# Patient Record
Sex: Male | Born: 1950 | Race: White | Hispanic: No | Marital: Married | Smoking: Current some day smoker
Health system: Southern US, Community
[De-identification: ages and names within clinical notes are randomized; demographics above are authoritative.]

## PROBLEM LIST (undated history)

## (undated) DIAGNOSIS — T7840XA Allergy, unspecified, initial encounter: Secondary | ICD-10-CM

## (undated) DIAGNOSIS — E785 Hyperlipidemia, unspecified: Secondary | ICD-10-CM

## (undated) DIAGNOSIS — M1990 Unspecified osteoarthritis, unspecified site: Secondary | ICD-10-CM

## (undated) DIAGNOSIS — J45901 Unspecified asthma with (acute) exacerbation: Secondary | ICD-10-CM

## (undated) DIAGNOSIS — R7302 Impaired glucose tolerance (oral): Secondary | ICD-10-CM

## (undated) DIAGNOSIS — J309 Allergic rhinitis, unspecified: Secondary | ICD-10-CM

## (undated) HISTORY — PX: TONSILLECTOMY: SHX5217

## (undated) HISTORY — DX: Allergy, unspecified, initial encounter: T78.40XA

## (undated) HISTORY — DX: Hyperlipidemia, unspecified: E78.5

## (undated) HISTORY — DX: Allergic rhinitis, unspecified: J30.9

## (undated) HISTORY — DX: Unspecified osteoarthritis, unspecified site: M19.90

## (undated) HISTORY — DX: Unspecified asthma with (acute) exacerbation: J45.901

## (undated) HISTORY — DX: Impaired glucose tolerance (oral): R73.02

---

## 2005-12-09 ENCOUNTER — Encounter: Admission: RE | Admit: 2005-12-09 | Discharge: 2005-12-09 | Payer: Self-pay | Admitting: Emergency Medicine

## 2008-02-12 ENCOUNTER — Ambulatory Visit: Payer: Self-pay | Admitting: Internal Medicine

## 2008-02-12 DIAGNOSIS — J45901 Unspecified asthma with (acute) exacerbation: Secondary | ICD-10-CM | POA: Insufficient documentation

## 2008-02-12 DIAGNOSIS — J309 Allergic rhinitis, unspecified: Secondary | ICD-10-CM | POA: Insufficient documentation

## 2008-02-12 DIAGNOSIS — E785 Hyperlipidemia, unspecified: Secondary | ICD-10-CM | POA: Insufficient documentation

## 2008-02-12 DIAGNOSIS — J45909 Unspecified asthma, uncomplicated: Secondary | ICD-10-CM | POA: Insufficient documentation

## 2008-02-12 HISTORY — DX: Allergic rhinitis, unspecified: J30.9

## 2008-02-12 HISTORY — DX: Unspecified asthma with (acute) exacerbation: J45.901

## 2008-02-12 HISTORY — DX: Hyperlipidemia, unspecified: E78.5

## 2008-02-13 ENCOUNTER — Telehealth (INDEPENDENT_AMBULATORY_CARE_PROVIDER_SITE_OTHER): Payer: Self-pay | Admitting: *Deleted

## 2008-02-13 LAB — CONVERTED CEMR LAB
ALT: 46 units/L (ref 0–53)
Albumin: 4.5 g/dL (ref 3.5–5.2)
Bilirubin Urine: NEGATIVE
Direct LDL: 147.3 mg/dL
Eosinophils Absolute: 0.3 10*3/uL (ref 0.0–0.7)
Eosinophils Relative: 4 % (ref 0.0–5.0)
GFR calc non Af Amer: 82 mL/min
HCT: 46.2 % (ref 39.0–52.0)
HDL: 28.2 mg/dL — ABNORMAL LOW (ref >39.0)
Hemoglobin, Urine: NEGATIVE
Ketones, ur: NEGATIVE mg/dL
MCHC: 34.9 g/dL (ref 30.0–36.0)
Monocytes Relative: 7.2 % (ref 3.0–12.0)
Neutro Abs: 6 10*3/uL (ref 1.4–7.7)
Neutrophils Relative %: 71.4 % (ref 43.0–77.0)
Platelets: 218 10*3/uL (ref 150–400)
Potassium: 4.1 meq/L (ref 3.5–5.1)
RDW: 12.2 % (ref 11.5–14.6)
Sodium: 138 meq/L (ref 135–145)
TSH: 1.24 microintl units/mL (ref 0.35–5.50)
Total Bilirubin: 1.4 mg/dL — ABNORMAL HIGH (ref 0.3–1.2)
Total Protein: 7.4 g/dL (ref 6.0–8.3)
Triglycerides: 190 mg/dL — ABNORMAL HIGH (ref 0–149)
Urine Glucose: NEGATIVE mg/dL

## 2009-11-21 ENCOUNTER — Encounter: Payer: Self-pay | Admitting: Internal Medicine

## 2010-01-31 NOTE — Miscellaneous (Signed)
Summary: Immunization Entry   Immunization History:  Influenza Immunization History:    Influenza:  historical (11/21/2009)  [facility], [address] [place] Vaccine, Fluvirin Dose, 0.5 ML Site, Left Deltoid IM Mfg. [employer] Date Administered 11/21/2009 Lot # [id number]

## 2010-09-05 ENCOUNTER — Encounter: Payer: Self-pay | Admitting: Internal Medicine

## 2010-09-16 ENCOUNTER — Other Ambulatory Visit: Payer: Self-pay | Admitting: Internal Medicine

## 2010-09-16 DIAGNOSIS — Z0000 Encounter for general adult medical examination without abnormal findings: Secondary | ICD-10-CM

## 2010-09-16 DIAGNOSIS — Z0389 Encounter for observation for other suspected diseases and conditions ruled out: Secondary | ICD-10-CM

## 2010-09-18 ENCOUNTER — Other Ambulatory Visit: Payer: Self-pay | Admitting: Internal Medicine

## 2010-09-18 ENCOUNTER — Other Ambulatory Visit (INDEPENDENT_AMBULATORY_CARE_PROVIDER_SITE_OTHER): Payer: Self-pay

## 2010-09-18 DIAGNOSIS — Z0389 Encounter for observation for other suspected diseases and conditions ruled out: Secondary | ICD-10-CM

## 2010-09-18 DIAGNOSIS — Z0000 Encounter for general adult medical examination without abnormal findings: Secondary | ICD-10-CM

## 2010-09-18 LAB — LDL CHOLESTEROL, DIRECT: Direct LDL: 126.4 mg/dL

## 2010-09-18 LAB — LIPID PANEL
HDL: 34.7 mg/dL — ABNORMAL LOW (ref >39.00)
Total CHOL/HDL Ratio: 7
Triglycerides: 300 mg/dL — ABNORMAL HIGH (ref 0.0–149.0)
VLDL: 60 mg/dL — ABNORMAL HIGH (ref 0.0–40.0)

## 2010-09-18 LAB — CBC WITH DIFFERENTIAL/PLATELET
Basophils Relative: 0.6 % (ref 0.0–3.0)
Eosinophils Absolute: 0.2 10*3/uL (ref 0.0–0.7)
Eosinophils Relative: 3.9 % (ref 0.0–5.0)
HCT: 45.7 % (ref 39.0–52.0)
Lymphocytes Relative: 27.6 % (ref 12.0–46.0)
Lymphs Abs: 1.4 10*3/uL (ref 0.7–4.0)
MCV: 94.5 fl (ref 78.0–100.0)
Monocytes Absolute: 0.4 10*3/uL (ref 0.1–1.0)
Neutrophils Relative %: 59.7 % (ref 43.0–77.0)
Platelets: 180 10*3/uL (ref 150.0–400.0)
WBC: 5.3 10*3/uL (ref 4.5–10.5)

## 2010-09-18 LAB — COMPREHENSIVE METABOLIC PANEL
Alkaline Phosphatase: 76 U/L (ref 39–117)
GFR: 73.22 mL/min (ref >60.00)
Sodium: 137 mEq/L (ref 135–145)

## 2010-09-18 LAB — URINALYSIS, ROUTINE W REFLEX MICROSCOPIC
Bilirubin Urine: NEGATIVE
Nitrite: NEGATIVE
Specific Gravity, Urine: 1.025 (ref 1.000–1.030)
Total Protein, Urine: NEGATIVE

## 2010-09-18 LAB — PSA: PSA: 1.31 ng/mL (ref 0.10–4.00)

## 2010-09-20 LAB — HEMOGLOBIN A1C: Hgb A1c MFr Bld: 5.4 % (ref 4.6–6.5)

## 2010-09-24 ENCOUNTER — Encounter: Payer: Self-pay | Admitting: Internal Medicine

## 2010-09-24 DIAGNOSIS — Z0000 Encounter for general adult medical examination without abnormal findings: Secondary | ICD-10-CM | POA: Insufficient documentation

## 2010-09-24 DIAGNOSIS — Z0001 Encounter for general adult medical examination with abnormal findings: Secondary | ICD-10-CM | POA: Insufficient documentation

## 2010-09-25 ENCOUNTER — Encounter: Payer: Self-pay | Admitting: Internal Medicine

## 2010-09-25 ENCOUNTER — Ambulatory Visit (INDEPENDENT_AMBULATORY_CARE_PROVIDER_SITE_OTHER): Payer: BC Managed Care – PPO | Admitting: Internal Medicine

## 2010-09-25 VITALS — BP 104/70 | HR 94 | Temp 98.2°F | Ht 73.0 in | Wt 211.1 lb

## 2010-09-25 DIAGNOSIS — Z23 Encounter for immunization: Secondary | ICD-10-CM

## 2010-09-25 DIAGNOSIS — Z2911 Encounter for prophylactic immunotherapy for respiratory syncytial virus (RSV): Secondary | ICD-10-CM

## 2010-09-25 DIAGNOSIS — Z0000 Encounter for general adult medical examination without abnormal findings: Secondary | ICD-10-CM

## 2010-09-25 MED ORDER — LEVALBUTEROL TARTRATE 45 MCG/ACT IN AERO: 2.0000 | INHALATION_SPRAY | Freq: Four times a day (QID) | RESPIRATORY_TRACT | Status: DC | PRN

## 2010-09-25 MED ORDER — PNEUMOCOCCAL VAC POLYVALENT 25 MCG/0.5ML IJ INJ: 0.5000 mL | INJECTION | Freq: Once | INTRAMUSCULAR | Status: DC

## 2010-09-25 MED ORDER — MONTELUKAST SODIUM 10 MG PO TABS: 10.0000 mg | ORAL_TABLET | Freq: Every day | ORAL | Status: DC

## 2010-09-25 NOTE — Patient Instructions (Signed)
You had the pneumonia and shingles shots today OK to take just the aspirin (coated) at 81 mg per day Continue all other medications as before (except ok to stop the advair) Please follow lower cholesterol, lower fat diet You are given the work note for the shoes today Please return in 1 year for your yearly visit, or sooner if needed, with Lab testing done 3-5 days before

## 2010-09-25 NOTE — Assessment & Plan Note (Addendum)
Overall doing well, age appropriate education and counseling updated, referrals for preventative services and immunizations addressed, dietary and smoking counseling addressed, most recent labs and ECG reviewed.  I have personally reviewed and have noted: 1) the patient's medical and social history 2) The pt's use of alcohol, tobacco, and illicit drugs 3) The patient's current medications and supplements 4) Functional ability including ADL's, fall risk, home safety risk, hearing and visual impairment 5) Diet and physical activities 6) Evidence for depression or mood disorder Ok for work note as requested. To decrease the ASSA to 81 mg again for prophylaxis.  For better lower chol diet 7) The patient's height, weight, and BMI have been recorded in the chart I have made referrals, and provided counseling and education based on review of the above ECG reviewed as per emr

## 2010-10-01 ENCOUNTER — Encounter: Payer: Self-pay | Admitting: Internal Medicine

## 2010-10-01 NOTE — Progress Notes (Signed)
Subjective:    Patient ID: Mark Wilson, male    DOB: 10/24/1950, 60 y.o.   MRN: [id number]  HPI  Here for wellness and f/u;  Overall doing ok;  Pt denies CP, worsening SOB, DOE, wheezing, orthopnea, PND, worsening LE edema, palpitations, dizziness or syncope.  Pt denies neurological change such as new Headache, facial or extremity weakness.  Pt denies polydipsia, polyuria, or low sugar symptoms. Pt states overall good compliance with treatment and medications, good tolerability, and trying to follow lower cholesterol diet.  Pt denies worsening depressive symptoms, suicidal ideation or panic. No fever, wt loss, night sweats, loss of appetite, or other constitutional symptoms.  Pt states good ability with ADL's, low fall risk, home safety reviewed and adequate, no significant changes in hearing or vision, and occasionally active with exercise.  Has not needed advair so no longer taking.  Is taking asa 325 on his own, hoping might help with right hip apin but not.  Needs note for work, has no right leg or hip pain when does not wear steel toed shoes at times when going up and down stairs. Past Medical History  Diagnosis Date  . ALLERGIC RHINITIS 02/12/2008  . ASTHMA UNSPECIFIED WITH EXACERBATION 02/12/2008  . HYPERLIPIDEMIA 02/12/2008   Past Surgical History  Procedure Date  . Tonsillectomy     reports that he has been smoking.  He does not have any smokeless tobacco history on file. His alcohol and drug histories not on file. family history includes Breast cancer in his maternal grandmother; Depression in his mother; Diabetes in his maternal grandmother; and Hypertension in his mother. Allergies  Allergen Reactions  . Penicillins    Current Outpatient Prescriptions on File Prior to Visit  Medication Sig Dispense Refill  . aspirin 81 MG tablet Take 81 mg by mouth daily.         No current facility-administered medications on file prior to visit.   Review of Systems Review of Systems    Constitutional: Negative for diaphoresis, activity change, appetite change and unexpected weight change.  HENT: Negative for hearing loss, ear pain, facial swelling, mouth sores and neck stiffness.   Eyes: Negative for pain, redness and visual disturbance.  Respiratory: Negative for shortness of breath and wheezing.   Cardiovascular: Negative for chest pain and palpitations.  Gastrointestinal: Negative for diarrhea, blood in stool, abdominal distention and rectal pain.  Genitourinary: Negative for hematuria, flank pain and decreased urine volume.  Musculoskeletal: Negative for myalgias and joint swelling.  Skin: Negative for color change and wound.  Neurological: Negative for syncope and numbness.  Hematological: Negative for adenopathy.  Psychiatric/Behavioral: Negative for hallucinations, self-injury, decreased concentration and agitation.      Objective:   Physical Exam BP 104/70  Pulse 94  Temp(Src) 98.2 F (36.8 C) (Oral)  Ht 6\' 1"  (1.854 m)  Wt 211 lb 2 oz (95.766 kg)  BMI 27.85 kg/m2  SpO2 97% Physical Exam  VS noted Constitutional: Pt is oriented to person, place, and time. Appears well-developed and well-nourished.  HENT:  Head: Normocephalic and atraumatic.  Right Ear: External ear normal.  Left Ear: External ear normal.  Nose: Nose normal.  Mouth/Throat: Oropharynx is clear and moist.  Eyes: Conjunctivae and EOM are normal. Pupils are equal, round, and reactive to light.  Neck: Normal range of motion. Neck supple. No JVD present. No tracheal deviation present.  Cardiovascular: Normal rate, regular rhythm, normal heart sounds and intact distal pulses.   Pulmonary/Chest: Effort normal and breath sounds normal.  Abdominal: Soft. Bowel sounds are normal. There is no tenderness.  Musculoskeletal: Normal range of motion. Exhibits no edema.  Lymphadenopathy:  Has no cervical adenopathy.  Neurological: Pt is alert and oriented to person, place, and time. Pt has normal  reflexes. No cranial nerve deficit.  Skin: Skin is warm and dry. No rash noted.  Psychiatric:  Has  normal mood and affect. Behavior is normal.     Assessment & Plan:

## 2010-10-18 ENCOUNTER — Telehealth: Payer: Self-pay

## 2010-10-18 NOTE — Telephone Encounter (Signed)
Pt singulair PA approved from 10/17/2010 07/12/2013. Pharmacy advised of same

## 2010-12-28 ENCOUNTER — Ambulatory Visit (INDEPENDENT_AMBULATORY_CARE_PROVIDER_SITE_OTHER): Payer: BC Managed Care – PPO | Admitting: Internal Medicine

## 2010-12-28 ENCOUNTER — Encounter: Payer: Self-pay | Admitting: Internal Medicine

## 2010-12-28 VITALS — BP 110/80 | HR 78 | Temp 97.9°F | Ht 73.0 in | Wt 218.0 lb

## 2010-12-28 DIAGNOSIS — J45909 Unspecified asthma, uncomplicated: Secondary | ICD-10-CM

## 2010-12-28 DIAGNOSIS — L989 Disorder of the skin and subcutaneous tissue, unspecified: Secondary | ICD-10-CM

## 2010-12-28 DIAGNOSIS — R7309 Other abnormal glucose: Secondary | ICD-10-CM

## 2010-12-28 DIAGNOSIS — Z0000 Encounter for general adult medical examination without abnormal findings: Secondary | ICD-10-CM

## 2010-12-28 DIAGNOSIS — E785 Hyperlipidemia, unspecified: Secondary | ICD-10-CM

## 2010-12-28 DIAGNOSIS — R7302 Impaired glucose tolerance (oral): Secondary | ICD-10-CM

## 2010-12-28 HISTORY — DX: Impaired glucose tolerance (oral): R73.02

## 2010-12-28 MED ORDER — ATORVASTATIN CALCIUM 10 MG PO TABS: 10.0000 mg | ORAL_TABLET | Freq: Every day | ORAL | Status: DC

## 2010-12-28 NOTE — Assessment & Plan Note (Signed)
Uncontrolled, after further discussion, pt ok with starting liptor 10 mg, f/u labs next visit;  Last ldl 129, goal < 100

## 2010-12-28 NOTE — Assessment & Plan Note (Signed)
Right upper lip, warty in appearance, but cant r/o skin ca - for derm referral

## 2010-12-28 NOTE — Assessment & Plan Note (Signed)
stable overall by hx and exam, most recent data reviewed with pt, and pt to continue medical treatment as before  Lab Results  Component Value Date   HGBA1C 5.4 09/18/2010

## 2010-12-28 NOTE — Progress Notes (Signed)
  Subjective:    Patient ID: Mark Wilson, male    DOB: [birth year], 60 y.o.   MRN: [id number]  HPI  Here to f/u; overall doing ok,  Pt denies chest pain, increased sob or doe, wheezing, orthopnea, PND, increased LE swelling, palpitations, dizziness or syncope.  Pt denies new neurological symptoms such as new headache, or facial or extremity weakness or numbness   Pt denies polydipsia, polyuria.  Pt states overall good compliance with meds, trying to follow lower cholesterol diet, wt overall stable but little exercise however.   Pt denies fever, wt loss, night sweats, loss of appetite, or other constitutional symptoms  Denies worsening depressive symptoms, suicidal ideation, or panic, though has ongoing anxiety, not increased recently.   Does have persistent skin lesion to area below the nares, above the right upper lip.  Due for colonscopy and would like to be referred Past Medical History  Diagnosis Date  . ALLERGIC RHINITIS 02/12/2008  . ASTHMA UNSPECIFIED WITH EXACERBATION 02/12/2008  . HYPERLIPIDEMIA 02/12/2008  . Impaired glucose tolerance 12/28/2010   Past Surgical History  Procedure Date  . Tonsillectomy     reports that he has been smoking.  He does not have any smokeless tobacco history on file. His alcohol and drug histories not on file. family history includes Breast cancer in his maternal grandmother; Depression in his mother; Diabetes in his maternal grandmother; and Hypertension in his mother. Allergies  Allergen Reactions  . Penicillins    Current Outpatient Prescriptions on File Prior to Visit  Medication Sig Dispense Refill  . aspirin 81 MG tablet Take 81 mg by mouth daily.        Marland Kitchen levalbuterol (XOPENEX HFA) 45 MCG/ACT inhaler Inhale 2 puffs into the lungs 4 (four) times daily as needed.  1 Inhaler  11  . montelukast (SINGULAIR) 10 MG tablet Take 1 tablet (10 mg total) by mouth at bedtime.  90 tablet  3   Current Facility-Administered Medications on File Prior to Visit    Medication Dose Route Frequency Provider Last Rate Last Dose  . pneumococcal 23 valent vaccine (PNU-IMMUNE) injection 0.5 mL  0.5 mL Intramuscular Once Oliver Barre, MD       Review of Systems Review of Systems  Constitutional: Negative for diaphoresis and unexpected weight change.  HENT: Negative for drooling and tinnitus.   Eyes: Negative for photophobia and visual disturbance.  Respiratory: Negative for choking and stridor.   Gastrointestinal: Negative for vomiting and blood in stool.  Genitourinary: Negative for hematuria and decreased urine volume.    Objective:   Physical Exam BP 110/80  Pulse 78  Temp(Src) 97.9 F (36.6 C) (Oral)  Ht 6\' 1"  (1.854 m)  Wt 218 lb (98.884 kg)  BMI 28.76 kg/m2  SpO2 96% Physical Exam  VS noted Constitutional: Pt appears well-developed and well-nourished.  HENT: Head: Normocephalic.  Right Ear: External ear normal.  Left Ear: External ear normal.  Eyes: Conjunctivae and EOM are normal. Pupils are equal, round, and reactive to light.  Neck: Normal range of motion. Neck supple.  Cardiovascular: Normal rate and regular rhythm.   Pulmonary/Chest: Effort normal and breath sounds normal.  Neurological: Pt is alert. No cranial nerve deficit.  Skin: Skin is warm. No erythema. approx 10 mm lesion to skin area below right nares/above right upper lip, NT, ? Warty in appearance, NT Psychiatric: Pt behavior is normal. Thought content normal.     Assessment & Plan:

## 2010-12-28 NOTE — Assessment & Plan Note (Signed)
stable overall by hx and exam, most recent data reviewed with pt, and pt to continue medical treatment as before  SpO2 Readings from Last 3 Encounters:  12/28/10 96%  09/25/10 97%

## 2010-12-28 NOTE — Assessment & Plan Note (Signed)
Not charged today, but now states ok with having screening colonsocpy - will refer

## 2010-12-28 NOTE — Patient Instructions (Addendum)
Take all new medications as prescribed - the generic lipitor 10 mg per day Continue all other medications as before You will be contacted regarding the referral for: dermatology, and colonoscopy Please return in Sept 2013 with Lab testing done 3-5 days before

## 2011-02-22 ENCOUNTER — Encounter: Payer: Self-pay | Admitting: Gastroenterology

## 2011-02-22 ENCOUNTER — Ambulatory Visit (AMBULATORY_SURGERY_CENTER): Payer: BC Managed Care – PPO | Admitting: *Deleted

## 2011-02-22 VITALS — Ht 72.0 in | Wt 217.4 lb

## 2011-02-22 DIAGNOSIS — Z1211 Encounter for screening for malignant neoplasm of colon: Secondary | ICD-10-CM

## 2011-02-22 MED ORDER — PEG-KCL-NACL-NASULF-NA ASC-C 100 G PO SOLR: ORAL | Status: DC

## 2011-03-08 ENCOUNTER — Encounter: Payer: Self-pay | Admitting: Gastroenterology

## 2011-03-08 ENCOUNTER — Ambulatory Visit (AMBULATORY_SURGERY_CENTER): Payer: BC Managed Care – PPO | Admitting: Gastroenterology

## 2011-03-08 VITALS — BP 155/95 | HR 90 | Temp 97.7°F | Resp 18 | Ht 72.0 in | Wt 217.0 lb

## 2011-03-08 DIAGNOSIS — D126 Benign neoplasm of colon, unspecified: Secondary | ICD-10-CM

## 2011-03-08 DIAGNOSIS — Z1211 Encounter for screening for malignant neoplasm of colon: Secondary | ICD-10-CM

## 2011-03-08 MED ORDER — SODIUM CHLORIDE 0.9 % IV SOLN: 500.0000 mL | INTRAVENOUS | Status: DC

## 2011-03-08 NOTE — Op Note (Signed)
[facility] [address] [place], [place]  [address]  COLONOSCOPY PROCEDURE REPORT  PATIENT:  Mark, Wilson  MR#:  [id number] BIRTHDATE:  June 27, 1950, 60 yrs. old  GENDER:  male ENDOSCOPIST:  Judie Petit T. Russella Dar, MD, [facility] Referred by:  Oliver Barre, M.D. PROCEDURE DATE:  03/08/2011 PROCEDURE:  Colonoscopy with biopsy and snare polypectomy ASA CLASS:  Class II INDICATIONS:  1) Routine Risk Screening MEDICATIONS:   These medications were titrated to patient response per physician's verbal order, Fentanyl 75 mcg IV, Versed 8 mg IV DESCRIPTION OF PROCEDURE:   After the risks benefits and alternatives of the procedure were thoroughly explained, informed consent was obtained.  Digital rectal exam was performed and revealed no abnormalities.   The [id number] endoscope was introduced through the anus and advanced to the cecum, which was identified by both the appendix and ileocecal valve, without limitations.  The quality of the prep was good, using MoviPrep. The instrument was then slowly withdrawn as the colon was fully examined. <<PROCEDUREIMAGES>> FINDINGS:  Moderate diverticulosis was found in the sigmoid to descending colon.  A sessile polyp was found in the sigmoid colon. It was 6 mm in size. Polyp was snared without cautery. Retrieval was successful. A sessile polyp was found in the sigmoid colon. It was 4 mm in size. The polyp was removed using cold biopsy forceps. Otherwise normal colonoscopy without other polyps, masses, vascular ectasias, or inflammatory changes.   Retroflexed views in the rectum revealed no abnormalities.  The time to cecum =  2 minutes. The scope was then withdrawn (time =  12.5  min) from the patient and the procedure completed.  COMPLICATIONS:  None  ENDOSCOPIC IMPRESSION: 1) Moderate diverticulosis in the sigmoid to descending colon 2) 6 mm sessile polyp in the sigmoid colon 3) 4 mm sessile polyp in the sigmoid colon  RECOMMENDATIONS: 1) Await  pathology results 2) High fiber diet with liberal fluid intake. 3) If the polyps are adenomatous (pre-cancerous), repeat colonoscopy in 5 years. Otherwise follow colorectal cancer screening guidelines for "routine risk" patients with colonoscopy in 10 years.  Venita Lick. Russella Dar, MD, [facility]  n. eSIGNED:   Venita Lick. Lorelei Heikkila at 03/08/2011 09:46 AM  Delight Ovens, [phone]

## 2011-03-08 NOTE — Progress Notes (Signed)
No complaints noted in the recovery room. Maw  Patient did not experience any of the following events: a burn prior to discharge; a fall within the facility; wrong site/side/patient/procedure/implant event; or a hospital transfer or hospital admission upon discharge from the facility. [phone]) Patient did not have preoperative order for IV antibiotic SSI prophylaxis. [phone])    Pt waited to be d/c dur to md needed to complete md eval post procedure. Maw

## 2011-03-08 NOTE — Patient Instructions (Signed)
Resume your prior medications today. Please call if any questions or concerns. Handouts given on Diverticulosis, High Fiber Diet with liberal fluid intake and polyps.   YOU HAD AN ENDOSCOPIC PROCEDURE TODAY AT THE [facility]: Refer to the procedure report that was given to you for any specific questions about what was found during the examination.  If the procedure report does not answer your questions, please call your gastroenterologist to clarify.  If you requested that your care partner not be given the details of your procedure findings, then the procedure report has been included in a sealed envelope for you to review at your convenience later.  YOU SHOULD EXPECT: Some feelings of bloating in the abdomen. Passage of more gas than usual.  Walking can help get rid of the air that was put into your GI tract during the procedure and reduce the bloating. If you had a lower endoscopy (such as a colonoscopy or flexible sigmoidoscopy) you may notice spotting of blood in your stool or on the toilet paper. If you underwent a bowel prep for your procedure, then you may not have a normal bowel movement for a few days.  DIET: Your first meal following the procedure should be a light meal and then it is ok to progress to your normal diet.  A half-sandwich or bowl of soup is an example of a good first meal.  Heavy or fried foods are harder to digest and may make you feel nauseous or bloated.  Likewise meals heavy in dairy and vegetables can cause extra gas to form and this can also increase the bloating.  Drink plenty of fluids but you should avoid alcoholic beverages for 24 hours.  ACTIVITY: Your care partner should take you home directly after the procedure.  You should plan to take it easy, moving slowly for the rest of the day.  You can resume normal activity the day after the procedure however you should NOT DRIVE or use heavy machinery for 24 hours (because of the sedation medicines used during  the test).    SYMPTOMS TO REPORT IMMEDIATELY: A gastroenterologist can be reached at any hour.  During normal business hours, 8:30 AM to 5:00 PM Monday through Friday, call [phone].  After hours and on weekends, please call the GI answering service at [phone] who will take a message and have the physician on call contact you.   Following lower endoscopy (colonoscopy or flexible sigmoidoscopy):  Excessive amounts of blood in the stool  Significant tenderness or worsening of abdominal pains  Swelling of the abdomen that is new, acute  Fever of 100F or higher    FOLLOW UP: If any biopsies were taken you will be contacted by phone or by letter within the next 1-3 weeks.  Call your gastroenterologist if you have not heard about the biopsies in 3 weeks.  Our staff will call the home number listed on your records the next business day following your procedure to check on you and address any questions or concerns that you may have at that time regarding the information given to you following your procedure. This is a courtesy call and so if there is no answer at the home number and we have not heard from you through the emergency physician on call, we will assume that you have returned to your regular daily activities without incident.  SIGNATURES/CONFIDENTIALITY: You and/or your care partner have signed paperwork which will be entered into your electronic medical record.  These  signatures attest to the fact that that the information above on your After Visit Summary has been reviewed and is understood.  Full responsibility of the confidentiality of this discharge information lies with you and/or your care-partner.

## 2011-03-09 ENCOUNTER — Telehealth: Payer: Self-pay | Admitting: *Deleted

## 2011-03-09 NOTE — Telephone Encounter (Signed)
  Follow up Call-  Call back number 03/08/2011  Post procedure Call Back phone  # [phone]  Permission to leave phone message Yes     Patient questions:  Do you have a fever, pain , or abdominal swelling? no Pain Score  0 *  Have you tolerated food without any problems? yes  Have you been able to return to your normal activities? yes  Do you have any questions about your discharge instructions: Diet   no Medications  no Follow up visit  no  Do you have questions or concerns about your Care? no  Actions: * If pain score is 4 or above: No action needed, pain <4.

## 2011-03-17 ENCOUNTER — Encounter: Payer: Self-pay | Admitting: Gastroenterology

## 2011-11-09 ENCOUNTER — Other Ambulatory Visit: Payer: Self-pay

## 2011-11-09 DIAGNOSIS — E785 Hyperlipidemia, unspecified: Secondary | ICD-10-CM

## 2011-11-09 MED ORDER — ATORVASTATIN CALCIUM 10 MG PO TABS: 10.0000 mg | ORAL_TABLET | Freq: Every day | ORAL | Status: DC

## 2012-02-03 ENCOUNTER — Other Ambulatory Visit: Payer: Self-pay | Admitting: Internal Medicine

## 2012-03-02 ENCOUNTER — Other Ambulatory Visit: Payer: Self-pay | Admitting: Internal Medicine

## 2012-03-07 ENCOUNTER — Ambulatory Visit: Payer: BC Managed Care – PPO | Admitting: Internal Medicine

## 2012-03-10 ENCOUNTER — Encounter: Payer: Self-pay | Admitting: Internal Medicine

## 2012-03-10 ENCOUNTER — Other Ambulatory Visit (INDEPENDENT_AMBULATORY_CARE_PROVIDER_SITE_OTHER): Payer: 59

## 2012-03-10 ENCOUNTER — Ambulatory Visit (INDEPENDENT_AMBULATORY_CARE_PROVIDER_SITE_OTHER): Payer: 59 | Admitting: Internal Medicine

## 2012-03-10 VITALS — BP 118/88 | HR 81 | Temp 97.9°F | Ht 72.0 in | Wt 226.4 lb

## 2012-03-10 DIAGNOSIS — Z0000 Encounter for general adult medical examination without abnormal findings: Secondary | ICD-10-CM

## 2012-03-10 DIAGNOSIS — R7309 Other abnormal glucose: Secondary | ICD-10-CM

## 2012-03-10 DIAGNOSIS — R7302 Impaired glucose tolerance (oral): Secondary | ICD-10-CM

## 2012-03-10 DIAGNOSIS — M25569 Pain in unspecified knee: Secondary | ICD-10-CM

## 2012-03-10 DIAGNOSIS — M25561 Pain in right knee: Secondary | ICD-10-CM | POA: Insufficient documentation

## 2012-03-10 LAB — LIPID PANEL
Cholesterol: 158 mg/dL (ref 0–200)
HDL: 33.2 mg/dL — ABNORMAL LOW (ref >39.00)
LDL Cholesterol: 90 mg/dL (ref 0–99)
Total CHOL/HDL Ratio: 5
Triglycerides: 176 mg/dL — ABNORMAL HIGH (ref 0.0–149.0)
VLDL: 35.2 mg/dL (ref 0.0–40.0)

## 2012-03-10 LAB — CBC WITH DIFFERENTIAL/PLATELET
Basophils Relative: 0.6 % (ref 0.0–3.0)
Eosinophils Relative: 3.2 % (ref 0.0–5.0)
Hemoglobin: 15.9 g/dL (ref 13.0–17.0)
MCV: 91.5 fl (ref 78.0–100.0)
Monocytes Absolute: 0.5 10*3/uL (ref 0.1–1.0)
Neutro Abs: 3.7 10*3/uL (ref 1.4–7.7)
Neutrophils Relative %: 61.8 % (ref 43.0–77.0)
RBC: 5.01 Mil/uL (ref 4.22–5.81)
WBC: 6 10*3/uL (ref 4.5–10.5)

## 2012-03-10 LAB — BASIC METABOLIC PANEL
BUN: 18 mg/dL (ref 6–23)
Calcium: 9.6 mg/dL (ref 8.4–10.5)
Chloride: 104 mEq/L (ref 96–112)
Creatinine, Ser: 1 mg/dL (ref 0.4–1.5)
GFR: 76.92 mL/min (ref >60.00)

## 2012-03-10 LAB — URINALYSIS, ROUTINE W REFLEX MICROSCOPIC
Ketones, ur: NEGATIVE
Leukocytes, UA: NEGATIVE
Nitrite: NEGATIVE
Specific Gravity, Urine: 1.02 (ref 1.000–1.030)
pH: 6 (ref 5.0–8.0)

## 2012-03-10 LAB — HEMOGLOBIN A1C: Hgb A1c MFr Bld: 5.6 % (ref 4.6–6.5)

## 2012-03-10 LAB — HEPATIC FUNCTION PANEL
ALT: 80 U/L — ABNORMAL HIGH (ref 0–53)
Total Bilirubin: 0.8 mg/dL (ref 0.3–1.2)

## 2012-03-10 LAB — TSH: TSH: 1.53 u[IU]/mL (ref 0.35–5.50)

## 2012-03-10 MED ORDER — ATORVASTATIN CALCIUM 10 MG PO TABS: ORAL_TABLET | ORAL | Status: DC

## 2012-03-10 MED ORDER — LEVALBUTEROL TARTRATE 45 MCG/ACT IN AERO: 2.0000 | INHALATION_SPRAY | Freq: Four times a day (QID) | RESPIRATORY_TRACT | Status: DC | PRN

## 2012-03-10 MED ORDER — OMEPRAZOLE 20 MG PO CPDR: 20.0000 mg | DELAYED_RELEASE_CAPSULE | Freq: Every day | ORAL | Status: DC

## 2012-03-10 MED ORDER — MONTELUKAST SODIUM 10 MG PO TABS: 10.0000 mg | ORAL_TABLET | Freq: Every day | ORAL | Status: DC

## 2012-03-10 MED ORDER — NAPROXEN 500 MG PO TABS: 500.0000 mg | ORAL_TABLET | Freq: Two times a day (BID) | ORAL | Status: DC

## 2012-03-10 NOTE — Assessment & Plan Note (Signed)
stable overall by history and exam, recent data reviewed with pt, and pt to continue medical treatment as before,  to f/u any worsening symptoms or concerns Lab Results  Component Value Date   HGBA1C 5.6 03/10/2012

## 2012-03-10 NOTE — Progress Notes (Signed)
Subjective:    Patient ID: Mark Wilson, male    DOB: May 13, 1950, 62 y.o.   MRN: [id number]  HPI  Here for wellness and f/u;  Overall doing ok;  Pt denies CP, worsening SOB, DOE, wheezing, orthopnea, PND, worsening LE edema, palpitations, dizziness or syncope.  Pt denies neurological change such as new headache, facial or extremity weakness.  Pt denies polydipsia, polyuria, or low sugar symptoms. Pt states overall good compliance with treatment and medications, good tolerability, and has been trying to follow lower cholesterol diet.  Pt denies worsening depressive symptoms, suicidal ideation or panic. No fever, night sweats, wt loss, loss of appetite, or other constitutional symptoms.  Pt states good ability with ADL's, has low fall risk, home safety reviewed and adequate, no other significant changes in hearing or vision, and only occasionally active with exercise.  Also With right knee pain and swelling for 2-3 mo, gradually worse, worse to walk and stand, some to just standing, better to sit, worst to try to stand on one leg;  Walks for exercise, used to do 1.5 mile per day, now less than 1/3 due to pain with ambulation.  Going up stairs is worse.  No trauma or twisting. No giveays or falls.  Brace to knee seems to help so can walk farther.  No click or catches.  Had remote pain to right knee in 70's where hit the bottom of a shallow water jump in the [employer], in [facility] in the [employer] [facility] for 3 wks.  No prior surgury or known DJD.  Has gained wt with reduced walking, about 15 lbs in 2-3 mo, also seems to make worse.  No fever, hx of gout or similar. Past Medical History  Diagnosis Date  . ALLERGIC RHINITIS 02/12/2008  . ASTHMA UNSPECIFIED WITH EXACERBATION 02/12/2008  . HYPERLIPIDEMIA 02/12/2008  . Impaired glucose tolerance 12/28/2010   Past Surgical History  Procedure Laterality Date  . Tonsillectomy      reports that he has been smoking Cigars.  He does not have any smokeless tobacco history on file. He  reports that he drinks about 4.8 ounces of alcohol per week. He reports that he does not use illicit drugs. family history includes Breast cancer in his maternal grandmother; Depression in his mother; Diabetes in his maternal grandmother; and Hypertension in his mother.  There is no history of Colon cancer, and Esophageal cancer, and Stomach cancer, and Rectal cancer, . Allergies  Allergen Reactions  . Penicillins Shortness Of Breath   Current Outpatient Prescriptions on File Prior to Visit  Medication Sig Dispense Refill  . aspirin 81 MG tablet Take 81 mg by mouth daily.        . fish oil-omega-3 fatty acids 1000 MG capsule Take 1,200 mg by mouth daily.      . Multiple Vitamins-Minerals (CENTRUM SILVER PO) Take 1 tablet by mouth daily.       No current facility-administered medications on file prior to visit.   Review of Systems Constitutional: Negative for diaphoresis, activity change, appetite change or unexpected weight change.  HENT: Negative for hearing loss, ear pain, facial swelling, mouth sores and neck stiffness.   Eyes: Negative for pain, redness and visual disturbance.  Respiratory: Negative for shortness of breath and wheezing.   Cardiovascular: Negative for chest pain and palpitations.  Gastrointestinal: Negative for diarrhea, blood in stool, abdominal distention or other pain Genitourinary: Negative for hematuria, flank pain or change in urine volume.  Musculoskeletal: Negative for myalgias and joint swelling.  Skin: Negative for color change and wound.  Neurological: Negative for syncope and numbness. other than noted Hematological: Negative for adenopathy.  Psychiatric/Behavioral: Negative for hallucinations, self-injury, decreased concentration and agitation.      Objective:   Physical Exam BP 118/88  Pulse 81  Temp(Src) 97.9 F (36.6 C) (Oral)  Ht 6' (1.829 m)  Wt 226 lb 6 oz (102.683 kg)  BMI 30.7 kg/m2  SpO2 96% VS noted,  Constitutional: Pt is oriented to  person, place, and time. Appears well-developed and well-nourished.  Head: Normocephalic and atraumatic.  Right Ear: External ear normal.  Left Ear: External ear normal.  Nose: Nose normal.  Mouth/Throat: Oropharynx is clear and moist.  Eyes: Conjunctivae and EOM are normal. Pupils are equal, round, and reactive to light.  Neck: Normal range of motion. Neck supple. No JVD present. No tracheal deviation present.  Cardiovascular: Normal rate, regular rhythm, normal heart sounds and intact distal pulses.   Pulmonary/Chest: Effort normal and breath sounds normal.  Abdominal: Soft. Bowel sounds are normal. There is no tenderness. No HSM  Musculoskeletal: Normal range of motion. Exhibits no edema.  Lymphadenopathy:  Has no cervical adenopathy.  Neurological: Pt is alert and oriented to person, place, and time. Pt has normal reflexes. No cranial nerve deficit.  Skin: Skin is warm and dry. No rash noted.  Psychiatric:  Has  normal mood and affect. Behavior is normal.  Right knee with mild crepitus, mild effusion, mild decreased ROM, and tender medial joint line    Assessment & Plan:

## 2012-03-10 NOTE — Patient Instructions (Addendum)
Please take all new medication as prescribed - the antiinflammatory and medicaiton to help protect the stomach (omeprazole) Please continue all other medications as before, and refills have all been done You will be contacted regarding the referral for: orthopedic Please go to the LAB in the Basement (turn left off the elevator) for the tests to be done today You will be contacted by phone if any changes need to be made immediately.  Otherwise, you will receive a letter about your results with an explanation, but please check with MyChart first. Thank you for enrolling in MyChart. Please follow the instructions below to securely access your online medical record. MyChart allows you to send messages to your doctor, view your test results, renew your prescriptions, schedule appointments, and more. To Log into My Chart online, please go by [employer] or [employer] to [email or web address], or download the MyChart App from the [employer] of [employer] .  Your Username is: [email or web address] (pass buddy) Please send a practice Message on Mychart later today. Please return in 1 year for your yearly visit, or sooner if needed, with Lab testing done 3-5 days before

## 2012-03-10 NOTE — Assessment & Plan Note (Signed)

## 2012-03-10 NOTE — Assessment & Plan Note (Signed)
C/with OA with functional limitation, for nsaid with PPI prophylaxis, refer ortho - likely needs cortisone injection

## 2012-06-30 ENCOUNTER — Other Ambulatory Visit: Payer: Self-pay | Admitting: Internal Medicine

## 2012-11-06 ENCOUNTER — Other Ambulatory Visit: Payer: Self-pay

## 2013-02-20 ENCOUNTER — Other Ambulatory Visit: Payer: Self-pay | Admitting: Internal Medicine

## 2013-03-17 ENCOUNTER — Other Ambulatory Visit: Payer: Self-pay | Admitting: Internal Medicine

## 2013-06-12 ENCOUNTER — Other Ambulatory Visit: Payer: Self-pay | Admitting: Internal Medicine

## 2013-06-15 ENCOUNTER — Other Ambulatory Visit: Payer: Self-pay | Admitting: *Deleted

## 2013-06-15 MED ORDER — ATORVASTATIN CALCIUM 10 MG PO TABS: ORAL_TABLET | ORAL | Status: DC

## 2013-07-09 ENCOUNTER — Other Ambulatory Visit: Payer: Self-pay | Admitting: Internal Medicine

## 2013-07-12 ENCOUNTER — Other Ambulatory Visit: Payer: Self-pay | Admitting: Internal Medicine

## 2013-07-13 ENCOUNTER — Other Ambulatory Visit: Payer: Self-pay | Admitting: Internal Medicine

## 2013-07-21 ENCOUNTER — Ambulatory Visit (INDEPENDENT_AMBULATORY_CARE_PROVIDER_SITE_OTHER): Payer: 59 | Admitting: Internal Medicine

## 2013-07-21 ENCOUNTER — Encounter: Payer: Self-pay | Admitting: Internal Medicine

## 2013-07-21 VITALS — BP 120/86 | HR 93 | Temp 98.2°F | Ht 72.0 in | Wt 225.4 lb

## 2013-07-21 DIAGNOSIS — M25562 Pain in left knee: Secondary | ICD-10-CM | POA: Insufficient documentation

## 2013-07-21 DIAGNOSIS — M25569 Pain in unspecified knee: Secondary | ICD-10-CM

## 2013-07-21 MED ORDER — TRAMADOL HCL 50 MG PO TABS: 50.0000 mg | ORAL_TABLET | Freq: Three times a day (TID) | ORAL | Status: DC | PRN

## 2013-07-21 MED ORDER — OMEPRAZOLE 20 MG PO CPDR: 20.0000 mg | DELAYED_RELEASE_CAPSULE | Freq: Every day | ORAL | Status: DC

## 2013-07-21 MED ORDER — ALBUTEROL SULFATE HFA 108 (90 BASE) MCG/ACT IN AERS: 2.0000 | INHALATION_SPRAY | Freq: Four times a day (QID) | RESPIRATORY_TRACT | Status: DC | PRN

## 2013-07-21 MED ORDER — ATORVASTATIN CALCIUM 10 MG PO TABS: ORAL_TABLET | ORAL | Status: DC

## 2013-07-21 NOTE — Progress Notes (Signed)
   Subjective:    Patient ID: Mark Wilson, male    DOB: 16-Feb-1950, 63 y.o.   MRN: [id number]  HPI Here with worsening left > right knees pain, intermittent, dull and sharp, assoc with swelling and medial left knee specifically now mod to severe, not well controlled with current naproxen, worse at night, worse to walk, better to sit.  No fever, trauma, falls or giveaways.  Works with walking quite a bit, fast with frequent turning.  Past Medical History  Diagnosis Date  . ALLERGIC RHINITIS 02/12/2008  . ASTHMA UNSPECIFIED WITH EXACERBATION 02/12/2008  . HYPERLIPIDEMIA 02/12/2008  . Impaired glucose tolerance 12/28/2010   Past Surgical History  Procedure Laterality Date  . Tonsillectomy      reports that he has been smoking Cigars.  He does not have any smokeless tobacco history on file. He reports that he drinks about 4.8 ounces of alcohol per week. He reports that he does not use illicit drugs. family history includes Breast cancer in his maternal grandmother; Depression in his mother; Diabetes in his maternal grandmother; Hypertension in his mother. There is no history of Colon cancer, Esophageal cancer, Stomach cancer, or Rectal cancer. Allergies  Allergen Reactions  . Penicillins Shortness Of Breath   Current Outpatient Prescriptions on File Prior to Visit  Medication Sig Dispense Refill  . aspirin 81 MG tablet Take 81 mg by mouth daily.        . fish oil-omega-3 fatty acids 1000 MG capsule Take 1,200 mg by mouth daily.      . Multiple Vitamins-Minerals (CENTRUM SILVER PO) Take 1 tablet by mouth daily.      . naproxen (NAPROSYN) 500 MG tablet TAKE 1 TABLET BY MOUTH TWICE DAILY  60 tablet  10   No current facility-administered medications on file prior to visit.   Review of Systems All otherwise neg per pt     Objective:   Physical Exam BP 120/86  Pulse 93  Temp(Src) 98.2 F (36.8 C) (Oral)  Ht 6' (1.829 m)  Wt 225 lb 6 oz (102.229 kg)  BMI 30.56 kg/m2  SpO2 95% VS noted,    Constitutional: Pt appears well-developed, well-nourished.  HENT: Head: NCAT.  Right Ear: External ear normal.  Left Ear: External ear normal.  Eyes: . Pupils are equal, round, and reactive to light. Conjunctivae and EOM are normal Neck: Normal range of motion. Neck supple.  Cardiovascular: Normal rate and regular rhythm.   Pulmonary/Chest: Effort normal and breath sounds normal.  Neurological: Pt is alert. Not confused , motor grossly intact Skin: Skin is warm. No rash Psychiatric: Pt behavior is normal. No agitation.  Left knee with medial joint line tender, mild medial swelling, FROM, mild crepitus    Assessment & Plan:

## 2013-07-21 NOTE — Patient Instructions (Signed)
Please take all new medication as prescribed  Please continue all other medications as before, and refills have been done if requested.  Please have the pharmacy call with any other refills you may need.  Please keep your appointments with your specialists as you may have planned  You will be contacted regarding the referral for: Dr Smith/sport medicine

## 2013-07-21 NOTE — Assessment & Plan Note (Signed)
?   Medial cartilage tear, for tramadol prn, also refer Dr Smith/sport med, with u/s ability - r/o cartilage/meniscal tear

## 2013-08-04 ENCOUNTER — Ambulatory Visit (INDEPENDENT_AMBULATORY_CARE_PROVIDER_SITE_OTHER): Payer: 59 | Admitting: Family Medicine

## 2013-08-04 ENCOUNTER — Ambulatory Visit (INDEPENDENT_AMBULATORY_CARE_PROVIDER_SITE_OTHER): Payer: 59

## 2013-08-04 ENCOUNTER — Encounter: Payer: Self-pay | Admitting: *Deleted

## 2013-08-04 ENCOUNTER — Encounter: Payer: Self-pay | Admitting: Family Medicine

## 2013-08-04 VITALS — BP 128/82 | HR 81 | Ht 72.0 in | Wt 222.0 lb

## 2013-08-04 DIAGNOSIS — M25569 Pain in unspecified knee: Secondary | ICD-10-CM

## 2013-08-04 DIAGNOSIS — M25562 Pain in left knee: Secondary | ICD-10-CM

## 2013-08-04 DIAGNOSIS — IMO0002 Reserved for concepts with insufficient information to code with codable children: Secondary | ICD-10-CM

## 2013-08-04 DIAGNOSIS — S83207A Unspecified tear of unspecified meniscus, current injury, left knee, initial encounter: Secondary | ICD-10-CM | POA: Insufficient documentation

## 2013-08-04 NOTE — Patient Instructions (Signed)
Good to meet you The patient is advised to apply ice or cold packs intermittently as needed to relieve pain. Exercises 3 times a week.  Wear brace at work for sure.  Come back in 3 weeks and we will see how you are doing.

## 2013-08-04 NOTE — Progress Notes (Signed)
[name] [address] [place], [place] [address] Phone: [phone] Subjective:    I'm seeing this patient by the request  of:  Cathlean Cower, MD   CC: Left knee pain  DVV:[id number] Mark Wilson is a 63 y.o. male coming in with complaint of left knee pain. Patient states that over the course last 2 months she's been having worsening pain on the medial aspect of his knee. Patient remembers initially while he was at work he did a twisting sensation and had pain. Since that time going up or down stairs or if he is on the knee for too long or sitting for long amount of time he has discomfort in this area. Patient states that there is some clicking that can cause mild pain but denies any giving out on him at this time. Patient states that the pain does not seem to wake him up at night but it is affecting his daily activities. Patient was the severity of 8/10. Describes as a dull aching pain that has not responded over-the-counter medicines.     Past medical history, social, surgical and family history all reviewed in electronic medical record.   Review of Systems: No headache, visual changes, nausea, vomiting, diarrhea, constipation, dizziness, abdominal pain, skin rash, fevers, chills, night sweats, weight loss, swollen lymph nodes, body aches, joint swelling, muscle aches, chest pain, shortness of breath, mood changes.   Objective Blood pressure 128/82, pulse 81, height 6' (1.829 m), weight 222 lb (100.699 kg), SpO2 95.00%.  General: No apparent distress alert and oriented x3 mood and affect normal, dressed appropriately.  HEENT: Pupils equal, extraocular movements intact  Respiratory: Patient's speak in full sentences and does not appear short of breath  Cardiovascular: No lower extremity edema, non tender, no erythema  Skin: Warm dry intact with no signs of infection or rash on extremities or on axial skeleton.  Abdomen: Soft nontender  Neuro: Cranial nerves II  through XII are intact, neurovascularly intact in all extremities with 2+ DTRs and 2+ pulses.  Lymph: No lymphadenopathy of posterior or anterior cervical chain or axillae bilaterally.  Gait normal with good balance and coordination.  MSK:  Non tender with full range of motion and good stability and symmetric strength and tone of shoulders, elbows, wrist, hip, and ankles bilaterally.  Knee: Left Normal to inspection with no erythema or effusion or obvious bony abnormalities. Tender over the medial joint line. ROM full in flexion and extension and lower leg rotation. Ligaments with solid consistent endpoints including ACL, PCL, LCL, MCL. Positive Mcmurray's, Apley's, and Thessalonian tests. Non painful patellar compression. Patellar glide without crepitus. Patellar and quadriceps tendons unremarkable. Hamstring and quadriceps strength is normal.  Contralateral knee unremarkable  MSK US performed of: Left knee This study was ordered, performed, and interpreted by Charlann Boxer D.O.  Knee: All structures visualized. Anteromedial meniscus does have a tear there is approximately 20% of the meniscus. Minimally displaced  anterolateral, posteromedial, and posterolateral menisci unremarkable without tearing, fraying, effusion, or displacement.  Patellar Tendon unremarkable on long and transverse views without effusion. No abnormality of prepatellar bursa. LCL and MCL unremarkable on long and transverse views. No abnormality of origin of medial or lateral head of the gastrocnemius.  IMPRESSION: Minimal displaced anterior medial meniscal tear  After informed written and verbal consent, patient was seated on exam table. Left knee was prepped with alcohol swab and utilizing anterolateral approach, patient's left knee space was injected with 4:1  marcaine 0.5%: Kenalog  40mg /dL. Patient tolerated the procedure well without immediate complications.    Impression and Recommendations:     This case  required medical decision making of moderate complexity.

## 2013-08-04 NOTE — Assessment & Plan Note (Signed)
Patient does have an acute meniscal tear of his left knee. Patient was given injection today with good resolution of pain. The discussed icing protocol and patient was given home exercise program. We discussed bracing as well. Patient will wear the brace with work. Patient will be out of work for the next 24 hours and is re start regular activity. Patient come back again in 3 weeks for further evaluation. Patient continues to have pain or any internal derangement further imaging may be necessary.

## 2013-08-25 ENCOUNTER — Ambulatory Visit (INDEPENDENT_AMBULATORY_CARE_PROVIDER_SITE_OTHER): Payer: 59 | Admitting: Family Medicine

## 2013-08-25 ENCOUNTER — Ambulatory Visit: Payer: 59 | Admitting: Internal Medicine

## 2013-08-25 ENCOUNTER — Encounter: Payer: Self-pay | Admitting: Family Medicine

## 2013-08-25 VITALS — BP 140/86 | HR 80 | Ht 72.0 in | Wt 217.0 lb

## 2013-08-25 DIAGNOSIS — S83207D Unspecified tear of unspecified meniscus, current injury, left knee, subsequent encounter: Secondary | ICD-10-CM

## 2013-08-25 DIAGNOSIS — Z5189 Encounter for other specified aftercare: Secondary | ICD-10-CM

## 2013-08-25 NOTE — Patient Instructions (Signed)
Good to see you Ice 20 minutes after activity.  Exercises still 2-3 times a week for another 6 weeks.  Wear brace as you need it.  Come back in 3 weeks if not perfect

## 2013-08-25 NOTE — Assessment & Plan Note (Signed)
Patient is doing markedly well at this time. Knee scan shows the patient's meniscal tear is healing very well. Encourage him to continue to wear the brace on an as-needed basis. Patient will continue the exercises most is the week for the next 6 weeks. Discuss continuing the icing. Patient will come back again in 3 weeks for further evaluation and treatment.

## 2013-08-25 NOTE — Progress Notes (Signed)
  [name] [place],  [address] Phone: [phone] Subjective:     CC: Left knee pain followup  [id number] Mark Wilson is a 63 y.o. male coming in with complaint of left knee pain. Patient was seen previously and did have an acute anterior medial meniscal tear. Patient was given a steroid injection, home exercises, icing protocol as well as a brace. Patient states this time he is 85 % better. Patient is not having any significant pain whatsoever. Patient states that he is able to do regular activities and sleeping comfortably. Not taking any medications. Patient is very happy with the results.     Past medical history, social, surgical and family history all reviewed in electronic medical record.   Review of Systems: No headache, visual changes, nausea, vomiting, diarrhea, constipation, dizziness, abdominal pain, skin rash, fevers, chills, night sweats, weight loss, swollen lymph nodes, body aches, joint swelling, muscle aches, chest pain, shortness of breath, mood changes.   Objective Blood pressure 140/86, pulse 80, height 6' (1.829 m), weight 217 lb (98.431 kg), SpO2 97.00%.  General: No apparent distress alert and oriented x3 mood and affect normal, dressed appropriately.  HEENT: Pupils equal, extraocular movements intact  Respiratory: Patient's speak in full sentences and does not appear short of breath  Cardiovascular: No lower extremity edema, non tender, no erythema  Skin: Warm dry intact with no signs of infection or rash on extremities or on axial skeleton.  Abdomen: Soft nontender  Neuro: Cranial nerves II through XII are intact, neurovascularly intact in all extremities with 2+ DTRs and 2+ pulses.  Lymph: No lymphadenopathy of posterior or anterior cervical chain or axillae bilaterally.  Gait normal with good balance and coordination.  MSK:  Non tender with full range of motion and good stability and symmetric strength and  tone of shoulders, elbows, wrist, hip, and ankles bilaterally.  Knee: Left Normal to inspection with no erythema or effusion or obvious bony abnormalities. Tender over the medial joint line but improved. ROM full in flexion and extension and lower leg rotation. Ligaments with solid consistent endpoints including ACL, PCL, LCL, MCL. Negative Mcmurray's, Apley's, and Thessalonian tests. Non painful patellar compression. Patellar glide without crepitus. Patellar and quadriceps tendons unremarkable. Hamstring and quadriceps strength is normal.  Contralateral knee unremarkable  MSK US performed of: Left knee This study was ordered, performed, and interpreted by Charlann Boxer D.O.  Knee: All structures visualized. Anteromedial meniscus does have a tear there is approximately 5% of the meniscus. No longer displaced  anterolateral, posteromedial, and posterolateral menisci unremarkable without tearing, fraying, effusion, or displacement.  Patellar Tendon unremarkable on long and transverse views without effusion. No abnormality of prepatellar bursa. LCL and MCL unremarkable on long and transverse views. No abnormality of origin of medial or lateral head of the gastrocnemius.  IMPRESSION: Healing meniscal tear      Impression and Recommendations:     This case required medical decision making of moderate complexity.

## 2013-09-15 ENCOUNTER — Ambulatory Visit: Payer: 59 | Admitting: Family Medicine

## 2013-09-21 ENCOUNTER — Other Ambulatory Visit: Payer: Self-pay | Admitting: Internal Medicine

## 2013-10-24 ENCOUNTER — Ambulatory Visit (INDEPENDENT_AMBULATORY_CARE_PROVIDER_SITE_OTHER): Payer: 59

## 2013-10-24 ENCOUNTER — Other Ambulatory Visit: Payer: Self-pay | Admitting: Internal Medicine

## 2013-10-24 DIAGNOSIS — Z23 Encounter for immunization: Secondary | ICD-10-CM

## 2014-04-19 ENCOUNTER — Other Ambulatory Visit: Payer: Self-pay | Admitting: Internal Medicine

## 2014-04-27 ENCOUNTER — Ambulatory Visit (INDEPENDENT_AMBULATORY_CARE_PROVIDER_SITE_OTHER): Payer: 59 | Admitting: Internal Medicine

## 2014-04-27 ENCOUNTER — Encounter: Payer: Self-pay | Admitting: Internal Medicine

## 2014-04-27 VITALS — BP 130/86 | HR 90 | Temp 98.0°F | Resp 18 | Ht 72.0 in | Wt 219.1 lb

## 2014-04-27 DIAGNOSIS — M25512 Pain in left shoulder: Secondary | ICD-10-CM

## 2014-04-27 DIAGNOSIS — Z0000 Encounter for general adult medical examination without abnormal findings: Secondary | ICD-10-CM | POA: Diagnosis not present

## 2014-04-27 MED ORDER — NAPROXEN 500 MG PO TABS: 500.0000 mg | ORAL_TABLET | Freq: Two times a day (BID) | ORAL | Status: DC

## 2014-04-27 MED ORDER — TRAMADOL HCL 50 MG PO TABS: 50.0000 mg | ORAL_TABLET | Freq: Three times a day (TID) | ORAL | Status: DC | PRN

## 2014-04-27 MED ORDER — PANTOPRAZOLE SODIUM 40 MG PO TBEC: 40.0000 mg | DELAYED_RELEASE_TABLET | Freq: Every day | ORAL | Status: DC

## 2014-04-27 NOTE — Progress Notes (Signed)
Subjective:    Patient ID: Mark Wilson, male    DOB: [birth year], 64 y.o.   MRN: [id number]  HPI  Here for wellness and f/u;  Overall doing ok;  Pt denies Chest pain, worsening SOB, DOE, wheezing, orthopnea, PND, worsening LE edema, palpitations, dizziness or syncope.  Pt denies neurological change such as new headache, facial or extremity weakness.  Pt denies polydipsia, polyuria, or low sugar symptoms. Pt states overall good compliance with treatment and medications, good tolerability, and has been trying to follow appropriate diet.  Pt denies worsening depressive symptoms, suicidal ideation or panic. No fever, night sweats, wt loss, loss of appetite, or other constitutional symptoms.  Pt states good ability with ADL's, has low fall risk, home safety reviewed and adequate, no other significant changes in hearing or vision, and only occasionally active with exercise.  C/o left shoulder pain moderate, occas severe, ongoing x 2 mo, graudally worse, aggrevated at work dialy, tramadol can help, but constant, persistent. Wearing seatbelt is like torture. No specific neck or radicular pain.  OTC patches not help.  Had a rash/reaction to topical OTC pain cream. 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 No recent hx of left shoulder issues, did have a severe strain left shoulder in 2000, better after cortisone x 2 shots, out of work for a week then.  Did have left knee cortisone recently per Dr Tamala Julian, no further pain.   Wt Readings from Last 3 Encounters:  04/27/14 219 lb 1.9 oz (99.392 kg)  08/25/13 217 lb (98.431 kg)  08/04/13 222 lb (100.699 kg)   Past Medical History  Diagnosis Date  . ALLERGIC RHINITIS 02/12/2008  . ASTHMA UNSPECIFIED WITH EXACERBATION 02/12/2008  . HYPERLIPIDEMIA 02/12/2008  . Impaired glucose tolerance 12/28/2010   Past Surgical History  Procedure Laterality Date  . Tonsillectomy      reports that he has  been smoking Cigars.  He does not have any smokeless tobacco history on file. He reports that he drinks about 4.8 oz of alcohol per week. He reports that he does not use illicit drugs. family history includes Breast cancer in his maternal grandmother; Depression in his mother; Diabetes in his maternal grandmother; Hypertension in his mother. There is no history of Colon cancer, Esophageal cancer, Stomach cancer, or Rectal cancer. Allergies  Allergen Reactions  . Penicillins Shortness Of Breath   Current Outpatient Prescriptions on File Prior to Visit  Medication Sig Dispense Refill  . albuterol (PROVENTIL HFA;VENTOLIN HFA) 108 (90 BASE) MCG/ACT inhaler Inhale 2 puffs into the lungs every 6 (six) hours as needed for wheezing or shortness of breath. 1 Inhaler 11  . aspirin 81 MG tablet Take 81 mg by mouth daily.      Marland Kitchen atorvastatin (LIPITOR) 10 MG tablet TAKE 1 TABLET BY MOUTH DAILY 30 tablet 11  . fish oil-omega-3 fatty acids 1000 MG capsule Take 1,200 mg by mouth daily.    . Multiple Vitamins-Minerals (CENTRUM SILVER PO) Take 1 tablet by mouth daily.     No current facility-administered medications on file prior to visit.   Review of Systems Constitutional: Negative for increased diaphoresis, other activity, appetite or siginficant weight change other than noted HENT: Negative for worsening hearing loss, ear pain, facial swelling, mouth sores and neck stiffness.   Eyes: Negative for other worsening pain, redness or visual disturbance.  Respiratory: Negative for shortness of breath and wheezing  Cardiovascular: Negative for chest  pain and palpitations.  Gastrointestinal: Negative for diarrhea, blood in stool, abdominal distention or other pain Genitourinary: Negative for hematuria, flank pain or change in urine volume.  Musculoskeletal: Negative for myalgias or other joint complaints.  Skin: Negative for color change and wound or drainage.  Neurological: Negative for syncope and numbness.  other than noted Hematological: Negative for adenopathy. or other swelling Psychiatric/Behavioral: Negative for hallucinations, SI, self-injury, decreased concentration or other worsening agitation.      Objective:   Physical Exam BP 130/86 mmHg  Pulse 90  Temp(Src) 98 F (36.7 C) (Oral)  Resp 18  Ht 6' (1.829 m)  Wt 219 lb 1.9 oz (99.392 kg)  BMI 29.71 kg/m2  SpO2 95% VS noted,  Constitutional: Pt is oriented to person, place, and time. Appears well-developed and well-nourished, in no significant distress Head: Normocephalic and atraumatic.  Right Ear: External ear normal.  Left Ear: External ear normal.  Nose: Nose normal.  Mouth/Throat: Oropharynx is clear and moist.  Eyes: Conjunctivae and EOM are normal. Pupils are equal, round, and reactive to light.  Neck: Normal range of motion. Neck supple. No JVD present. No tracheal deviation present or significant neck LA or mass Cardiovascular: Normal rate, regular rhythm, normal heart sounds and intact distal pulses.   Pulmonary/Chest: Effort normal and breath sounds without rales or wheezing  Abdominal: Soft. Bowel sounds are normal. NT. No HSM  Musculoskeletal: Normal range of motion. Exhibits no edema.  Lymphadenopathy:  Has no cervical adenopathy.  Neurological: Pt is alert and oriented to person, place, and time. Pt has normal reflexes. No cranial nerve deficit. Motor grossly intact Skin: Skin is warm and dry. No rash noted.  Psychiatric:  Has normal mood and affect. Behavior is normal.  Has tender left bicipital tendon insertion site    Assessment & Plan:

## 2014-04-27 NOTE — Patient Instructions (Addendum)
Please continue all other medications as before, and refills have been done if requested - the tramadol and naproxyn  Please have the pharmacy call with any other refills you may need.  Please continue your efforts at being more active, low cholesterol diet, and weight control.  You are otherwise up to date with prevention measures today.  You will be contacted regarding the referral for: Dr Tamala Julian for the shoulder  Please keep your appointments with your specialists as you may have planned  Please call fi you change your mind about further lab work  Please remember to sign up for MyChart if you have not done so, as this will be important to you in the future with finding out test results, communicating by private email, and scheduling acute appointments online when needed.  Please return in 1 year for your yearly visit, or sooner if needed

## 2014-04-27 NOTE — Assessment & Plan Note (Signed)

## 2014-04-27 NOTE — Progress Notes (Signed)
Pre visit review using our clinic review tool, if applicable. No additional management support is needed unless otherwise documented below in the visit note. 

## 2014-04-27 NOTE — Assessment & Plan Note (Signed)
C/w bicipital tendonitis, for pain control with nsaid prn, tramadol for breakthrough pain, and refer Dr Smith/sport med

## 2014-04-28 ENCOUNTER — Ambulatory Visit (INDEPENDENT_AMBULATORY_CARE_PROVIDER_SITE_OTHER): Payer: 59 | Admitting: Family Medicine

## 2014-04-28 ENCOUNTER — Other Ambulatory Visit (INDEPENDENT_AMBULATORY_CARE_PROVIDER_SITE_OTHER): Payer: 59

## 2014-04-28 ENCOUNTER — Encounter: Payer: Self-pay | Admitting: Family Medicine

## 2014-04-28 VITALS — BP 124/84 | HR 100 | Wt 227.0 lb

## 2014-04-28 DIAGNOSIS — M75102 Unspecified rotator cuff tear or rupture of left shoulder, not specified as traumatic: Secondary | ICD-10-CM

## 2014-04-28 DIAGNOSIS — M25512 Pain in left shoulder: Secondary | ICD-10-CM | POA: Diagnosis not present

## 2014-04-28 NOTE — Progress Notes (Signed)
[facility] [place], [place] [address] Phone: [phone] Subjective:    CC: Left shoulder pain  FIE:[id number] Mark Wilson is a 64 y.o. male coming in with complaint of left shoulder pain. Patient discusses the pain is more of a dull throbbing aching sensation that has been going on for multiple weeks. Patient denies any new numbness or any radiation of pain. Mostly seems to be on the anterior lateral aspect of the shoulder. It is waking him up at night and is affecting his daily activities including his job. Patient is also noticed some mild weakness of the shoulder as well. Rates the severity of 5 out of 10.     Past medical history, social, surgical and family history all reviewed in electronic medical record.   Review of Systems: No headache, visual changes, nausea, vomiting, diarrhea, constipation, dizziness, abdominal pain, skin rash, fevers, chills, night sweats, weight loss, swollen lymph nodes, body aches, joint swelling, muscle aches, chest pain, shortness of breath, mood changes.   Objective Blood pressure 124/84, pulse 100, weight 227 lb (102.967 kg), SpO2 94 %.  General: No apparent distress alert and oriented x3 mood and affect normal, dressed appropriately.  HEENT: Pupils equal, extraocular movements intact  Respiratory: Patient's speak in full sentences and does not appear short of breath  Cardiovascular: No lower extremity edema, non tender, no erythema  Skin: Warm dry intact with no signs of infection or rash on extremities or on axial skeleton.  Abdomen: Soft nontender  Neuro: Cranial nerves II through XII are intact, neurovascularly intact in all extremities with 2+ DTRs and 2+ pulses.  Lymph: No lymphadenopathy of posterior or anterior cervical chain or axillae bilaterally.  Gait normal with good balance and coordination.  MSK:  Non tender with full range of motion and good stability and symmetric strength and tone of  elbows,  wrist, hip, knee and ankles bilaterally.  Shoulder: left Inspection reveals no abnormalities, atrophy or asymmetry. Palpation is normal with no tenderness over AC joint or bicipital groove. ROM is full in all planes passively. 4-5 rotator cuff strength compared to 5 out of 5 on the contralateral side. signs of impingement with positive Neer and Hawkin's tests, but negative empty can sign. Speeds and Yergason's tests normal. No labral pathology noted with negative Obrien's, negative clunk and good stability. Normal scapular function observed. No painful arc and no drop arm sign. No apprehension sign Contralateral shoulder unremarkable  MSK US performed of: left This study was ordered, performed, and interpreted by Charlann Boxer D.O.  Shoulder:   Supraspinatus:  Patient does have a tear that seemed to be full-thickness but patient does have scar tissue formation. No significant retraction noted. Increasing Doppler flow still noted. Infraspinatus:  Appears normal on long and transverse views. Significant increase in Doppler flow Subscapularis:  Degenerative changes Teres Minor:  Appears normal on long and transverse views. AC joint:  Capsule undistended, no geyser sign. Glenohumeral Joint:  Appears normal without effusion. Glenoid Labrum:  Intact without visualized tears. Biceps Tendon:  Appears normal on long and transverse views, no fraying of tendon, tendon located in intertubercular groove, no subluxation with shoulder internal or external rotation.  Impression: Degenerative changes of the rotator cuff  Procedure: Real-time Ultrasound Guided Injection of left glenohumeral joint Device: GE Logiq E  Ultrasound guided injection is preferred based studies that show increased duration, increased effect, greater accuracy, decreased procedural pain, increased response rate with ultrasound guided versus blind injection.  Verbal  informed consent obtained.  Time-out conducted.  Noted no  overlying erythema, induration, or other signs of local infection.  Skin prepped in a sterile fashion.  Local anesthesia: Topical Ethyl chloride.  With sterile technique and under real time ultrasound guidance:  Joint visualized.  23g 1  inch needle inserted posterior approach. Pictures taken for needle placement. Patient did have injection of 2 cc of 1% lidocaine, 2 cc of 0.5% Marcaine, and 1.0 cc of Kenalog 40 mg/dL. Completed without difficulty  Pain immediately resolved suggesting accurate placement of the medication.  Advised to call if fevers/chills, erythema, induration, drainage, or persistent bleeding.  Images permanently stored and available for review in the ultrasound unit.  Impression: Technically successful ultrasound guided injection.  Procedure note 30940; 15 minutes spent for Therapeutic exercises as stated in above notes.  This included exercises focusing on stretching, strengthening, with significant focus on eccentric aspects.  Shoulder Exercises that included:  Basic scapular stabilization to include adduction and depression of scapula Scaption, focusing on proper movement and good control Internal and External rotation utilizing a theraband, with elbow tucked at side entire time Rows with theraband Proper technique shown and discussed handout in great detail with ATC.  All questions were discussed and answered.      Impression and Recommendations:     This case required medical decision making of moderate complexity.

## 2014-04-28 NOTE — Assessment & Plan Note (Signed)
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 Patient showing some healing with no significant retractions I do think patient can get better with conservative therapy. Patient was given an injection for symptomatic relief. We discussed icing regimen and patient did work with Product/process development scientist to learn home exercises in greater detail. Patient given a trial topical anti-inflammatory. Patient will be on light duty at work for the next 2 weeks. Patient and will come back and see me again in 2 weeks for further evaluation and treatment. Into did have pain he will need an x-ray and potentially formal physical therapy.

## 2014-04-28 NOTE — Patient Instructions (Signed)
Good to see you again You do have a rotator cuff tear but it is healing We will keep you out of work for a little bit Ice 20 minutes 2 times daily. Usually after activity and before bed. Exercises 3 times a week.  Try pennsaid twice daily See me agai ni 2 weeks.

## 2014-04-29 ENCOUNTER — Telehealth: Payer: Self-pay | Admitting: Internal Medicine

## 2014-04-29 NOTE — Telephone Encounter (Signed)
Pt called in said that his is suppose to be on light duty but his work is saying that there isnt any light duty.  He needs a note to take him out of work for 2 week till his injury heals?  If so the fax number is listed below    Fax number [phone]

## 2014-04-29 NOTE — Telephone Encounter (Signed)
OK to forward to Dr Tamala Julian, thanks

## 2014-04-29 NOTE — Telephone Encounter (Signed)
i can only state what he can do.  If his work does not have light duty they need to keep him out of work.

## 2014-04-30 ENCOUNTER — Encounter: Payer: Self-pay | Admitting: *Deleted

## 2014-04-30 NOTE — Telephone Encounter (Signed)
Pt called back again insisiting that we give him this note to put him out for 2 weeks. Per Dr. Tamala Julian, it is okay to write the note. Pt was made aware that we put him on light duty to prevent his employer from firing him. Pt understood. Letter faxed.

## 2014-04-30 NOTE — Telephone Encounter (Signed)
Ria Comment, he asked if we would just fax it to [phone] and write him out til may 13th, which is his fu appt with Dr Tamala Julian

## 2014-05-14 ENCOUNTER — Ambulatory Visit: Payer: 59 | Admitting: Family Medicine

## 2014-05-18 ENCOUNTER — Telehealth: Payer: Self-pay

## 2014-05-18 NOTE — Telephone Encounter (Signed)
Pantoprazole needs a PA. Do you want [place] to continue with it or do you want to change the rx.

## 2014-05-18 NOTE — Telephone Encounter (Signed)
Ok to ask pt if he is willing to try prilosec 20 mg   - 2 per day

## 2014-05-19 ENCOUNTER — Other Ambulatory Visit: Payer: Self-pay

## 2014-05-19 MED ORDER — OMEPRAZOLE 20 MG PO CPDR: 20.0000 mg | DELAYED_RELEASE_CAPSULE | Freq: Two times a day (BID) | ORAL | Status: DC

## 2014-05-19 NOTE — Telephone Encounter (Signed)
LVM for pt to call back as soon as possible.   RE: rx question.

## 2014-05-19 NOTE — Telephone Encounter (Signed)
Spoke to pt and he will try the omeprazole 20 mg bid for its therapeutic effect and then let us know.

## 2014-05-19 NOTE — Telephone Encounter (Signed)
FYI: Pantoprazole 40 mg tablets are needing PA.  BIN: [id number] PCN: [id number] GRP: [id number]

## 2014-06-27 ENCOUNTER — Other Ambulatory Visit: Payer: Self-pay | Admitting: Internal Medicine

## 2014-07-22 ENCOUNTER — Other Ambulatory Visit: Payer: Self-pay | Admitting: Internal Medicine

## 2014-07-24 ENCOUNTER — Other Ambulatory Visit: Payer: Self-pay | Admitting: Internal Medicine

## 2014-08-22 ENCOUNTER — Other Ambulatory Visit: Payer: Self-pay | Admitting: Internal Medicine

## 2014-08-23 ENCOUNTER — Other Ambulatory Visit: Payer: Self-pay

## 2014-08-23 MED ORDER — NAPROXEN 500 MG PO TABS: 500.0000 mg | ORAL_TABLET | Freq: Two times a day (BID) | ORAL | Status: DC

## 2014-10-10 ENCOUNTER — Other Ambulatory Visit: Payer: Self-pay | Admitting: Internal Medicine

## 2014-10-21 ENCOUNTER — Telehealth: Payer: Self-pay | Admitting: Internal Medicine

## 2014-10-21 NOTE — Telephone Encounter (Signed)
Pt called request shingle injection. Please advise, pt will check with the insurance company how much they will cover.

## 2014-10-21 NOTE — Telephone Encounter (Signed)
No need since this was already done sept 2012

## 2014-10-22 ENCOUNTER — Ambulatory Visit (INDEPENDENT_AMBULATORY_CARE_PROVIDER_SITE_OTHER): Payer: 59

## 2014-10-22 DIAGNOSIS — Z23 Encounter for immunization: Secondary | ICD-10-CM

## 2014-11-18 ENCOUNTER — Other Ambulatory Visit: Payer: Self-pay | Admitting: Internal Medicine

## 2015-01-18 ENCOUNTER — Other Ambulatory Visit: Payer: Self-pay | Admitting: Internal Medicine

## 2015-03-31 ENCOUNTER — Other Ambulatory Visit: Payer: Self-pay | Admitting: Internal Medicine

## 2015-04-06 ENCOUNTER — Other Ambulatory Visit: Payer: Self-pay | Admitting: Internal Medicine

## 2015-04-29 ENCOUNTER — Other Ambulatory Visit: Payer: Self-pay | Admitting: Internal Medicine

## 2015-05-06 ENCOUNTER — Other Ambulatory Visit: Payer: Self-pay | Admitting: Internal Medicine

## 2015-05-27 ENCOUNTER — Other Ambulatory Visit: Payer: Self-pay | Admitting: Internal Medicine

## 2015-06-02 ENCOUNTER — Other Ambulatory Visit: Payer: Self-pay | Admitting: Internal Medicine

## 2015-06-30 ENCOUNTER — Other Ambulatory Visit: Payer: Self-pay | Admitting: Internal Medicine

## 2015-07-01 ENCOUNTER — Other Ambulatory Visit: Payer: Self-pay | Admitting: Internal Medicine

## 2015-07-28 ENCOUNTER — Other Ambulatory Visit: Payer: Self-pay | Admitting: Internal Medicine

## 2015-08-25 ENCOUNTER — Other Ambulatory Visit: Payer: Self-pay | Admitting: Internal Medicine

## 2015-09-22 ENCOUNTER — Other Ambulatory Visit: Payer: Self-pay | Admitting: Internal Medicine

## 2015-09-26 ENCOUNTER — Other Ambulatory Visit: Payer: Self-pay | Admitting: Internal Medicine

## 2015-10-22 ENCOUNTER — Other Ambulatory Visit: Payer: Self-pay | Admitting: Internal Medicine

## 2015-10-31 ENCOUNTER — Other Ambulatory Visit: Payer: Self-pay | Admitting: Internal Medicine

## 2015-11-01 ENCOUNTER — Ambulatory Visit (INDEPENDENT_AMBULATORY_CARE_PROVIDER_SITE_OTHER): Payer: 59 | Admitting: General Practice

## 2015-11-01 DIAGNOSIS — Z23 Encounter for immunization: Secondary | ICD-10-CM | POA: Diagnosis not present

## 2015-11-29 ENCOUNTER — Other Ambulatory Visit: Payer: Self-pay | Admitting: Internal Medicine

## 2015-12-29 ENCOUNTER — Other Ambulatory Visit: Payer: Self-pay | Admitting: Internal Medicine

## 2015-12-29 NOTE — Telephone Encounter (Signed)
Pt may also have labs done at [place]?  Ok for one mo refill, and ask pt if also has labs at [place], and to forward lab results if possible;  will need ROV jan 2018 please

## 2015-12-29 NOTE — Telephone Encounter (Signed)
Routing to dr john---patients last office visit was April/2016---last labs done in 2014---are you ok with refilling or do you need office visit--please advise, thanks

## 2016-01-26 ENCOUNTER — Other Ambulatory Visit: Payer: Self-pay | Admitting: Internal Medicine

## 2016-02-01 ENCOUNTER — Encounter: Payer: Self-pay | Admitting: Gastroenterology

## 2016-02-27 ENCOUNTER — Other Ambulatory Visit: Payer: Self-pay | Admitting: Internal Medicine

## 2016-03-14 ENCOUNTER — Encounter: Payer: Self-pay | Admitting: Internal Medicine

## 2016-03-14 ENCOUNTER — Other Ambulatory Visit (INDEPENDENT_AMBULATORY_CARE_PROVIDER_SITE_OTHER): Payer: 59

## 2016-03-14 ENCOUNTER — Ambulatory Visit (INDEPENDENT_AMBULATORY_CARE_PROVIDER_SITE_OTHER): Payer: 59 | Admitting: Internal Medicine

## 2016-03-14 VITALS — BP 118/84 | HR 86 | Temp 97.6°F | Ht 72.0 in | Wt 224.0 lb

## 2016-03-14 DIAGNOSIS — Z86010 Personal history of colonic polyps: Secondary | ICD-10-CM | POA: Insufficient documentation

## 2016-03-14 DIAGNOSIS — Z23 Encounter for immunization: Secondary | ICD-10-CM | POA: Diagnosis not present

## 2016-03-14 DIAGNOSIS — M545 Low back pain, unspecified: Secondary | ICD-10-CM | POA: Insufficient documentation

## 2016-03-14 DIAGNOSIS — Z0000 Encounter for general adult medical examination without abnormal findings: Secondary | ICD-10-CM | POA: Diagnosis not present

## 2016-03-14 DIAGNOSIS — Z1159 Encounter for screening for other viral diseases: Secondary | ICD-10-CM

## 2016-03-14 DIAGNOSIS — M170 Bilateral primary osteoarthritis of knee: Secondary | ICD-10-CM | POA: Diagnosis not present

## 2016-03-14 DIAGNOSIS — Z0001 Encounter for general adult medical examination with abnormal findings: Secondary | ICD-10-CM

## 2016-03-14 DIAGNOSIS — R7989 Other specified abnormal findings of blood chemistry: Secondary | ICD-10-CM

## 2016-03-14 DIAGNOSIS — R7302 Impaired glucose tolerance (oral): Secondary | ICD-10-CM | POA: Diagnosis not present

## 2016-03-14 DIAGNOSIS — E785 Hyperlipidemia, unspecified: Secondary | ICD-10-CM

## 2016-03-14 LAB — CBC WITH DIFFERENTIAL/PLATELET
Basophils Absolute: 0.1 10*3/uL (ref 0.0–0.1)
Basophils Relative: 1.1 % (ref 0.0–3.0)
EOS PCT: 3.1 % (ref 0.0–5.0)
Eosinophils Absolute: 0.2 10*3/uL (ref 0.0–0.7)
HCT: 46.7 % (ref 39.0–52.0)
Hemoglobin: 16.2 g/dL (ref 13.0–17.0)
LYMPHS ABS: 1.5 10*3/uL (ref 0.7–4.0)
Lymphocytes Relative: 26.4 % (ref 12.0–46.0)
MCHC: 34.7 g/dL (ref 30.0–36.0)
MCV: 93.3 fl (ref 78.0–100.0)
MONO ABS: 0.6 10*3/uL (ref 0.1–1.0)
MONOS PCT: 10.1 % (ref 3.0–12.0)
NEUTROS ABS: 3.4 10*3/uL (ref 1.4–7.7)
NEUTROS PCT: 59.3 % (ref 43.0–77.0)
Platelets: 196 10*3/uL (ref 150.0–400.0)
RBC: 5 Mil/uL (ref 4.22–5.81)
RDW: 13.2 % (ref 11.5–15.5)
WBC: 5.7 10*3/uL (ref 4.0–10.5)

## 2016-03-14 LAB — HEPATIC FUNCTION PANEL
ALK PHOS: 72 U/L (ref 39–117)
ALT: 69 U/L — ABNORMAL HIGH (ref 0–53)
AST: 40 U/L — AB (ref 0–37)
Albumin: 4.5 g/dL (ref 3.5–5.2)
BILIRUBIN DIRECT: 0.1 mg/dL (ref 0.0–0.3)
TOTAL PROTEIN: 7 g/dL (ref 6.0–8.3)
Total Bilirubin: 0.8 mg/dL (ref 0.2–1.2)

## 2016-03-14 LAB — LIPID PANEL
CHOL/HDL RATIO: 6
Cholesterol: 198 mg/dL (ref 0–200)
HDL: 31.5 mg/dL — ABNORMAL LOW (ref >39.00)
NONHDL: 166.08
TRIGLYCERIDES: 313 mg/dL — AB (ref 0.0–149.0)
VLDL: 62.6 mg/dL — ABNORMAL HIGH (ref 0.0–40.0)

## 2016-03-14 LAB — BASIC METABOLIC PANEL
BUN: 18 mg/dL (ref 6–23)
CALCIUM: 9.9 mg/dL (ref 8.4–10.5)
CHLORIDE: 105 meq/L (ref 96–112)
CO2: 28 meq/L (ref 19–32)
Creatinine, Ser: 1.11 mg/dL (ref 0.40–1.50)
GFR: 70.45 mL/min (ref >60.00)
Glucose, Bld: 105 mg/dL — ABNORMAL HIGH (ref 70–99)
POTASSIUM: 4.1 meq/L (ref 3.5–5.1)
SODIUM: 140 meq/L (ref 135–145)

## 2016-03-14 LAB — LDL CHOLESTEROL, DIRECT: LDL DIRECT: 119 mg/dL

## 2016-03-14 LAB — HEMOGLOBIN A1C: Hgb A1c MFr Bld: 5.6 % (ref 4.6–6.5)

## 2016-03-14 LAB — PSA: PSA: 2.47 ng/mL (ref 0.10–4.00)

## 2016-03-14 LAB — TSH: TSH: 1.29 u[IU]/mL (ref 0.35–4.50)

## 2016-03-14 MED ORDER — NAPROXEN 500 MG PO TABS: 500.0000 mg | ORAL_TABLET | Freq: Two times a day (BID) | ORAL | 3 refills | Status: DC

## 2016-03-14 MED ORDER — ATORVASTATIN CALCIUM 10 MG PO TABS: 10.0000 mg | ORAL_TABLET | Freq: Every day | ORAL | 3 refills | Status: DC

## 2016-03-14 MED ORDER — TRAMADOL HCL 50 MG PO TABS: 50.0000 mg | ORAL_TABLET | Freq: Three times a day (TID) | ORAL | 5 refills | Status: DC | PRN

## 2016-03-14 MED ORDER — OMEPRAZOLE 20 MG PO CPDR: DELAYED_RELEASE_CAPSULE | ORAL | 3 refills | Status: DC

## 2016-03-14 NOTE — Progress Notes (Signed)
Subjective:    Patient ID: Mark Wilson, male    DOB: [birth year], 66 y.o.   MRN: [id number]  HPI  Here for wellness and f/u;  Overall doing ok;  Pt denies Chest pain, worsening SOB, DOE, wheezing, orthopnea, PND, worsening LE edema, palpitations, dizziness or syncope.  Pt denies neurological change such as new headache, facial or extremity weakness.  Pt denies polydipsia, polyuria, or low sugar symptoms. Pt states overall good compliance with treatment and medications, good tolerability, and has been trying to follow appropriate diet.  Pt denies worsening depressive symptoms, suicidal ideation or panic. No fever, night sweats, wt loss, loss of appetite, or other constitutional symptoms.  Pt states good ability with ADL's, has low fall risk, home safety reviewed and adequate, no other significant changes in hearing or vision, and only occasionally active with exercise. No other changes to basic hx.  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  Likes to keep working, Pt continues to have recurring LBP without change in severity, bowel or bladder change, fever, wt loss,  worsening LE pain/numbness/weakness, gait change or falls.  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 Increased size and wonders about this.   Past Medical History:  Diagnosis Date  . ALLERGIC RHINITIS 02/12/2008  . ASTHMA UNSPECIFIED WITH EXACERBATION 02/12/2008  . HYPERLIPIDEMIA 02/12/2008  . Impaired glucose tolerance 12/28/2010   Past Surgical History:  Procedure Laterality Date  . TONSILLECTOMY      reports that he has been smoking Cigars.  He uses smokeless tobacco. He reports that he drinks about 4.8 oz of alcohol per week . He reports that he does not use drugs. family history includes Breast cancer in his maternal grandmother; Depression in his mother; Diabetes in his maternal grandmother;  Hypertension in his mother. Allergies  Allergen Reactions  . Penicillins Shortness Of Breath   Current Outpatient Prescriptions on File Prior to Visit  Medication Sig Dispense Refill  . albuterol (PROAIR HFA) 108 (90 BASE) MCG/ACT inhaler Inhale 2 puffs into the lungs every 6 (six) hours as needed for wheezing or shortness of breath. 8.5 g 5  . aspirin 81 MG tablet Take 81 mg by mouth daily.      . fish oil-omega-3 fatty acids 1000 MG capsule Take 1,200 mg by mouth daily.    . Multiple Vitamins-Minerals (CENTRUM SILVER PO) Take 1 tablet by mouth daily.     No current facility-administered medications on file prior to visit.    Review of Systems Constitutional: Negative for increased diaphoresis, or other activity, appetite or siginficant weight change other than noted HENT: Negative for worsening hearing loss, ear pain, facial swelling, mouth sores and neck stiffness.   Eyes: Negative for other worsening pain, redness or visual disturbance.  Respiratory: Negative for choking or stridor Cardiovascular: Negative for other chest pain and palpitations.  Gastrointestinal: Negative for worsening diarrhea, blood in stool, or abdominal distention Genitourinary: Negative for hematuria, flank pain or change in urine volume.  Musculoskeletal: Negative for myalgias or other joint complaints.  Skin: Negative for other color change and wound or drainage.  Neurological: Negative for syncope and numbness. other than noted Hematological: Negative for adenopathy. or other swelling Psychiatric/Behavioral: Negative for hallucinations, SI, self-injury, decreased concentration or other worsening agitation.  All other system neg per pt    Objective:   Physical Exam BP 118/84  Pulse 86   Temp 97.6 F (36.4 C)   Ht 6' (1.829 m)   Wt 224 lb (101.6 kg)   SpO2 99%   BMI 30.38 kg/m  VS noted,  Constitutional: Pt is oriented to person, place, and time. Appears well-developed and well-nourished, in no  significant distress Head: Normocephalic and atraumatic  Eyes: Conjunctivae and EOM are normal. Pupils are equal, round, and reactive to light Right Ear: External ear normal.  Left Ear: External ear normal Nose: Nose normal.  Mouth/Throat: Oropharynx is clear and moist  Neck: Normal range of motion. Neck supple. No JVD present. No tracheal deviation present or significant neck LA or mass Cardiovascular: Normal rate, regular rhythm, normal heart sounds and intact distal pulses.   Pulmonary/Chest: Effort normal and breath sounds without rales or wheezing  Abdominal: Soft. Bowel sounds are normal. NT. No HSM  Musculoskeletal: Normal range of motion. Exhibits no edema Lymphadenopathy: Has no cervical adenopathy.  Neurological: Pt is alert and oriented to person, place, and time. Pt has normal reflexes. No cranial nerve deficit. Motor grossly intact Skin: Skin is warm and dry. No rash noted or new ulcers Psychiatric:  Has normal mood and affect. Behavior is normal. Mild nervous Spine nontender and not lumbar paravertebral tender    Assessment & Plan:

## 2016-03-14 NOTE — Patient Instructions (Addendum)
You had the Tdap tetanus shot today  You will be contacted regarding the referral for: colonoscopy  Please continue all other medications as before, and refills have been done if requested.  Please have the pharmacy call with any other refills you may need.  Please continue your efforts at being more active, low cholesterol diet, and weight control.  You are otherwise up to date with prevention measures today.  Please keep your appointments with your specialists as you may have planned  Please go to the LAB in the Basement (turn left off the elevator) for the tests to be done today  You will be contacted by phone if any changes need to be made immediately.  Otherwise, you will receive a letter about your results with an explanation, but please check with MyChart first.  Please remember to sign up for MyChart if you have not done so, as this will be important to you in the future with finding out test results, communicating by private email, and scheduling acute appointments online when needed.  Please return in 1 year for your yearly visit, or sooner if needed, with Lab testing done 3-5 days before  

## 2016-03-15 ENCOUNTER — Other Ambulatory Visit: Payer: Self-pay | Admitting: Internal Medicine

## 2016-03-15 DIAGNOSIS — R972 Elevated prostate specific antigen [PSA]: Secondary | ICD-10-CM

## 2016-03-15 LAB — URINALYSIS, ROUTINE W REFLEX MICROSCOPIC
Bilirubin Urine: NEGATIVE
Hgb urine dipstick: NEGATIVE
Ketones, ur: NEGATIVE
Leukocytes, UA: NEGATIVE
Nitrite: NEGATIVE
PH: 6 (ref 5.0–8.0)
RBC / HPF: NONE SEEN (ref 0–?)
SPECIFIC GRAVITY, URINE: 1.015 (ref 1.000–1.030)
Total Protein, Urine: NEGATIVE
Urine Glucose: NEGATIVE
Urobilinogen, UA: 1 (ref 0.0–1.0)

## 2016-03-15 LAB — HIV ANTIBODY (ROUTINE TESTING W REFLEX): HIV: NONREACTIVE

## 2016-03-15 LAB — HEPATITIS C ANTIBODY: HCV AB: NEGATIVE

## 2016-03-18 NOTE — Assessment & Plan Note (Signed)
Mild to mod, for pain control, consider f/u with Dr Tamala Julian, ,  to f/u any worsening symptoms or concerns

## 2016-03-18 NOTE — Assessment & Plan Note (Signed)
Lab Results  Component Value Date   HGBA1C 5.6 03/14/2016   stable

## 2016-03-18 NOTE — Assessment & Plan Note (Signed)
Lab Results  Component Value Date   LDLCALC 90 03/10/2012  stable

## 2016-03-18 NOTE — Assessment & Plan Note (Signed)
Due for colonoscopy 

## 2016-03-18 NOTE — Assessment & Plan Note (Signed)

## 2016-03-18 NOTE — Assessment & Plan Note (Signed)
Chronic stble, for pain control

## 2016-04-23 ENCOUNTER — Encounter: Payer: Self-pay | Admitting: Internal Medicine

## 2016-09-25 ENCOUNTER — Ambulatory Visit (INDEPENDENT_AMBULATORY_CARE_PROVIDER_SITE_OTHER): Payer: 59 | Admitting: General Practice

## 2016-09-25 DIAGNOSIS — Z23 Encounter for immunization: Secondary | ICD-10-CM | POA: Diagnosis not present

## 2017-03-09 ENCOUNTER — Other Ambulatory Visit: Payer: Self-pay | Admitting: Internal Medicine

## 2017-03-14 ENCOUNTER — Ambulatory Visit: Payer: 59 | Admitting: Internal Medicine

## 2017-03-17 ENCOUNTER — Other Ambulatory Visit: Payer: Self-pay | Admitting: Internal Medicine

## 2017-03-21 ENCOUNTER — Other Ambulatory Visit (INDEPENDENT_AMBULATORY_CARE_PROVIDER_SITE_OTHER): Payer: 59

## 2017-03-21 ENCOUNTER — Encounter: Payer: Self-pay | Admitting: Internal Medicine

## 2017-03-21 ENCOUNTER — Ambulatory Visit: Payer: 59 | Admitting: Internal Medicine

## 2017-03-21 VITALS — BP 128/88 | HR 82 | Temp 97.8°F | Ht 72.0 in | Wt 229.0 lb

## 2017-03-21 DIAGNOSIS — R7302 Impaired glucose tolerance (oral): Secondary | ICD-10-CM

## 2017-03-21 DIAGNOSIS — Z86010 Personal history of colonic polyps: Secondary | ICD-10-CM | POA: Diagnosis not present

## 2017-03-21 DIAGNOSIS — E785 Hyperlipidemia, unspecified: Secondary | ICD-10-CM

## 2017-03-21 DIAGNOSIS — Z0000 Encounter for general adult medical examination without abnormal findings: Secondary | ICD-10-CM | POA: Diagnosis not present

## 2017-03-21 LAB — BASIC METABOLIC PANEL
BUN: 16 mg/dL (ref 6–23)
CHLORIDE: 103 meq/L (ref 96–112)
CO2: 26 mEq/L (ref 19–32)
Calcium: 10.3 mg/dL (ref 8.4–10.5)
Creatinine, Ser: 1.14 mg/dL (ref 0.40–1.50)
GFR: 68.1 mL/min (ref >60.00)
Glucose, Bld: 137 mg/dL — ABNORMAL HIGH (ref 70–99)
POTASSIUM: 4.9 meq/L (ref 3.5–5.1)
SODIUM: 139 meq/L (ref 135–145)

## 2017-03-21 LAB — CBC WITH DIFFERENTIAL/PLATELET
BASOS ABS: 0.1 10*3/uL (ref 0.0–0.1)
Basophils Relative: 1.1 % (ref 0.0–3.0)
EOS ABS: 0.2 10*3/uL (ref 0.0–0.7)
Eosinophils Relative: 3.6 % (ref 0.0–5.0)
HEMATOCRIT: 47.1 % (ref 39.0–52.0)
HEMOGLOBIN: 16.3 g/dL (ref 13.0–17.0)
LYMPHS PCT: 26.1 % (ref 12.0–46.0)
Lymphs Abs: 1.4 10*3/uL (ref 0.7–4.0)
MCHC: 34.6 g/dL (ref 30.0–36.0)
MCV: 93.6 fl (ref 78.0–100.0)
MONO ABS: 0.5 10*3/uL (ref 0.1–1.0)
Monocytes Relative: 10 % (ref 3.0–12.0)
Neutro Abs: 3.1 10*3/uL (ref 1.4–7.7)
Neutrophils Relative %: 59.2 % (ref 43.0–77.0)
Platelets: 170 10*3/uL (ref 150.0–400.0)
RBC: 5.03 Mil/uL (ref 4.22–5.81)
RDW: 13.1 % (ref 11.5–15.5)
WBC: 5.2 10*3/uL (ref 4.0–10.5)

## 2017-03-21 LAB — LIPID PANEL
CHOL/HDL RATIO: 4
Cholesterol: 170 mg/dL (ref 0–200)
HDL: 38.7 mg/dL — AB (ref >39.00)
LDL Cholesterol: 96 mg/dL (ref 0–99)
NONHDL: 131.36
Triglycerides: 175 mg/dL — ABNORMAL HIGH (ref 0.0–149.0)
VLDL: 35 mg/dL (ref 0.0–40.0)

## 2017-03-21 LAB — HEPATIC FUNCTION PANEL
ALBUMIN: 5 g/dL (ref 3.5–5.2)
ALT: 38 U/L (ref 0–53)
AST: 29 U/L (ref 0–37)
Alkaline Phosphatase: 69 U/L (ref 39–117)
Bilirubin, Direct: 0.2 mg/dL (ref 0.0–0.3)
TOTAL PROTEIN: 7.2 g/dL (ref 6.0–8.3)
Total Bilirubin: 0.8 mg/dL (ref 0.2–1.2)

## 2017-03-21 LAB — URINALYSIS, ROUTINE W REFLEX MICROSCOPIC
BILIRUBIN URINE: NEGATIVE
Hgb urine dipstick: NEGATIVE
KETONES UR: NEGATIVE
Leukocytes, UA: NEGATIVE
Nitrite: NEGATIVE
PH: 6.5 (ref 5.0–8.0)
RBC / HPF: NONE SEEN (ref 0–?)
Specific Gravity, Urine: 1.01 (ref 1.000–1.030)
TOTAL PROTEIN, URINE-UPE24: NEGATIVE
UROBILINOGEN UA: 0.2 (ref 0.0–1.0)
Urine Glucose: NEGATIVE

## 2017-03-21 LAB — HEMOGLOBIN A1C: Hgb A1c MFr Bld: 5.5 % (ref 4.6–6.5)

## 2017-03-21 LAB — TSH: TSH: 1.28 u[IU]/mL (ref 0.35–4.50)

## 2017-03-21 LAB — PSA: PSA: 0.81 ng/mL (ref 0.10–4.00)

## 2017-03-21 MED ORDER — ATORVASTATIN CALCIUM 20 MG PO TABS: 20.0000 mg | ORAL_TABLET | Freq: Every day | ORAL | 3 refills | Status: DC

## 2017-03-21 MED ORDER — OMEPRAZOLE 20 MG PO CPDR: DELAYED_RELEASE_CAPSULE | ORAL | 3 refills | Status: DC

## 2017-03-21 MED ORDER — ALBUTEROL SULFATE HFA 108 (90 BASE) MCG/ACT IN AERS: 2.0000 | INHALATION_SPRAY | Freq: Four times a day (QID) | RESPIRATORY_TRACT | 5 refills | Status: DC | PRN

## 2017-03-21 MED ORDER — TRAMADOL HCL 50 MG PO TABS: 50.0000 mg | ORAL_TABLET | Freq: Three times a day (TID) | ORAL | 2 refills | Status: DC | PRN

## 2017-03-21 MED ORDER — NAPROXEN 500 MG PO TABS: 500.0000 mg | ORAL_TABLET | Freq: Two times a day (BID) | ORAL | 3 refills | Status: DC

## 2017-03-21 MED ORDER — ATORVASTATIN CALCIUM 10 MG PO TABS: ORAL_TABLET | ORAL | 3 refills | Status: DC

## 2017-03-21 NOTE — Patient Instructions (Addendum)
You will be contacted regarding the referral for: colonoscopy  OK to increase the lipitor to 20 mg  Please continue all other medications as before, and refills have been done if requested.  Please have the pharmacy call with any other refills you may need.  Please continue your efforts at being more active, low cholesterol diet, and weight control.  You are otherwise up to date with prevention measures today.  Please keep your appointments with your specialists as you may have planned  Please go to the LAB in the Basement (turn left off the elevator) for the tests to be done today  You will be contacted by phone if any changes need to be made immediately.  Otherwise, you will receive a letter about your results with an explanation, but please check with MyChart first.  Please remember to sign up for MyChart if you have not done so, as this will be important to you in the future with finding out test results, communicating by private email, and scheduling acute appointments online when needed.  Please return in 1 year for your yearly visit, or sooner if needed, with Lab testing done 3-5 days before

## 2017-03-21 NOTE — Progress Notes (Signed)
Subjective:    Patient ID: Mark Wilson, male    DOB: January 02, 1950, 67 y.o.   MRN: [id number]  HPI  Here for wellness and f/u;  Overall doing ok;  Pt denies Chest pain, worsening SOB, DOE, wheezing, orthopnea, PND, worsening LE edema, palpitations, dizziness or syncope.  Pt denies neurological change such as new headache, facial or extremity weakness.  Pt denies polydipsia, polyuria, or low sugar symptoms. Pt states overall good compliance with treatment and medications, good tolerability, and has been trying to follow appropriate diet.  Pt denies worsening depressive symptoms, suicidal ideation or panic. No fever, night sweats, wt loss, loss of appetite, or other constitutional symptoms.  Pt states good ability with ADL's, has low fall risk, home safety reviewed and adequate, no other significant changes in hearing or vision, and not active with exercise.  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  He was put off by the $800 bill so put it off to now. Denies urinary symptoms such as dysuria, frequency, urgency, flank pain, hematuria or n/v, fever, chills.  No other new complaints or interval hx Past Medical History:  Diagnosis Date  . ALLERGIC RHINITIS 02/12/2008  . ASTHMA UNSPECIFIED WITH EXACERBATION 02/12/2008  . HYPERLIPIDEMIA 02/12/2008  . Impaired glucose tolerance 12/28/2010   Past Surgical History:  Procedure Laterality Date  . TONSILLECTOMY      reports that he has been smoking cigars.  He uses smokeless tobacco. He reports that he drinks about 4.8 oz of alcohol per week. He reports that he does not use drugs. family history includes Breast cancer in his maternal grandmother; Depression in his mother; Diabetes in his maternal grandmother; Hypertension in his mother. Allergies  Allergen Reactions  . Penicillins Shortness Of Breath   Current Outpatient Medications on File Prior to Visit  Medication Sig Dispense Refill  . aspirin 81 MG tablet Take 81 mg by  mouth daily.      . fish oil-omega-3 fatty acids 1000 MG capsule Take 1,200 mg by mouth daily.    . Multiple Vitamins-Minerals (CENTRUM SILVER PO) Take 1 tablet by mouth daily.     No current facility-administered medications on file prior to visit.    Review of Systems Constitutional: Negative for other unusual diaphoresis, sweats, appetite or weight changes HENT: Negative for other worsening hearing loss, ear pain, facial swelling, mouth sores or neck stiffness.   Eyes: Negative for other worsening pain, redness or other visual disturbance.  Respiratory: Negative for other stridor or swelling Cardiovascular: Negative for other palpitations or other chest pain  Gastrointestinal: Negative for worsening diarrhea or loose stools, blood in stool, distention or other pain Genitourinary: Negative for hematuria, flank pain or other change in urine volume.  Musculoskeletal: Negative for myalgias or other joint swelling.  Skin: Negative for other color change, or other wound or worsening drainage.  Neurological: Negative for other syncope or numbness. Hematological: Negative for other adenopathy or swelling Psychiatric/Behavioral: Negative for hallucinations, other worsening agitation, SI, self-injury, or new decreased concentration All other system neg per pt    Objective:   Physical Exam BP 128/88   Pulse 82   Temp 97.8 F (36.6 C) (Oral)   Ht 6' (1.829 m)   Wt 229 lb (103.9 kg)   SpO2 98%   BMI 31.06 kg/m  VS noted,  Constitutional: Pt is oriented to person, place, and time. Appears well-developed and well-nourished, in no significant distress and comfortable Head: Normocephalic and atraumatic  Eyes: Conjunctivae and  EOM are normal. Pupils are equal, round, and reactive to light Right Ear: External ear normal without discharge Left Ear: External ear normal without discharge Nose: Nose without discharge or deformity Mouth/Throat: Oropharynx is without other ulcerations and moist    Neck: Normal range of motion. Neck supple. No JVD present. No tracheal deviation present or significant neck LA or mass Cardiovascular: Normal rate, regular rhythm, normal heart sounds and intact distal pulses.   Pulmonary/Chest: WOB normal and breath sounds without rales or wheezing  Abdominal: Soft. Bowel sounds are normal. NT. No HSM  Musculoskeletal: Normal range of motion. Exhibits no edema Lymphadenopathy: Has no other cervical adenopathy.  Neurological: Pt is alert and oriented to person, place, and time. Pt has normal reflexes. No cranial nerve deficit. Motor grossly intact, Gait intact Skin: Skin is warm and dry. No rash noted or new ulcerations Psychiatric:  Has normal mood and affect. Behavior is normal without agitation No other exam findings    Assessment & Plan:

## 2017-03-23 NOTE — Assessment & Plan Note (Signed)
[place] for increased lipitor to 20 qd

## 2017-03-23 NOTE — Assessment & Plan Note (Signed)
For colonoscopy 

## 2017-03-23 NOTE — Assessment & Plan Note (Signed)
stable overall by history and exam, recent data reviewed with pt, and pt to continue medical treatment as before,  to f/u any worsening symptoms or concerns Lab Results  Component Value Date   HGBA1C 5.5 03/21/2017

## 2017-03-23 NOTE — Assessment & Plan Note (Signed)

## 2017-03-25 ENCOUNTER — Telehealth: Payer: Self-pay | Admitting: Internal Medicine

## 2017-03-25 MED ORDER — TRAMADOL HCL 50 MG PO TABS: 50.0000 mg | ORAL_TABLET | Freq: Three times a day (TID) | ORAL | 2 refills | Status: DC | PRN

## 2017-03-25 NOTE — Telephone Encounter (Signed)
Copied from [place] [phone]. Topic: Quick Communication - Rx Refill/Question >> Mar 25, 2017 11:16 AM Scherrie Gerlach wrote: Medication: traMADol (ULTRAM) 50 MG tablet Has the patient contacted their pharmacy? yes  Pt states this Rx was sent to the wrong [facility], and now they will not transfer this med.  Pt needs resent to [place], [place] [phone] (Phone) [phone] (Fax)

## 2017-03-25 NOTE — Telephone Encounter (Signed)
Pt states his tramadol that was prescribed on 03/21/17 went to the wrong pharmacy ([facility]). Needs to be sent to [id number] at [facility].   LOV  03/21/17 NOV  03/26/17 Provider: Dr. Cathlean Cower  Please review.

## 2017-03-25 NOTE — Telephone Encounter (Signed)
Done erx 

## 2017-04-22 ENCOUNTER — Encounter: Payer: Self-pay | Admitting: Internal Medicine

## 2017-04-27 ENCOUNTER — Other Ambulatory Visit: Payer: Self-pay | Admitting: Internal Medicine

## 2017-06-02 ENCOUNTER — Other Ambulatory Visit: Payer: Self-pay | Admitting: Internal Medicine

## 2017-09-17 ENCOUNTER — Ambulatory Visit (INDEPENDENT_AMBULATORY_CARE_PROVIDER_SITE_OTHER): Payer: 59

## 2017-09-17 DIAGNOSIS — Z23 Encounter for immunization: Secondary | ICD-10-CM | POA: Diagnosis not present

## 2018-03-11 ENCOUNTER — Ambulatory Visit: Payer: 59 | Admitting: Internal Medicine

## 2018-03-11 ENCOUNTER — Encounter: Payer: Self-pay | Admitting: Internal Medicine

## 2018-03-11 ENCOUNTER — Other Ambulatory Visit (INDEPENDENT_AMBULATORY_CARE_PROVIDER_SITE_OTHER): Payer: 59

## 2018-03-11 VITALS — BP 134/88 | HR 92 | Temp 97.6°F | Ht 72.0 in | Wt 223.0 lb

## 2018-03-11 DIAGNOSIS — Z23 Encounter for immunization: Secondary | ICD-10-CM | POA: Diagnosis not present

## 2018-03-11 DIAGNOSIS — M542 Cervicalgia: Secondary | ICD-10-CM

## 2018-03-11 DIAGNOSIS — Z0001 Encounter for general adult medical examination with abnormal findings: Secondary | ICD-10-CM

## 2018-03-11 DIAGNOSIS — R7302 Impaired glucose tolerance (oral): Secondary | ICD-10-CM

## 2018-03-11 DIAGNOSIS — Z0000 Encounter for general adult medical examination without abnormal findings: Secondary | ICD-10-CM | POA: Diagnosis not present

## 2018-03-11 LAB — CBC WITH DIFFERENTIAL/PLATELET
Basophils Absolute: 0 10*3/uL (ref 0.0–0.1)
Basophils Relative: 0.6 % (ref 0.0–3.0)
Eosinophils Absolute: 0.2 10*3/uL (ref 0.0–0.7)
Eosinophils Relative: 2.2 % (ref 0.0–5.0)
HCT: 47.9 % (ref 39.0–52.0)
Hemoglobin: 16.6 g/dL (ref 13.0–17.0)
Lymphocytes Relative: 22.5 % (ref 12.0–46.0)
Lymphs Abs: 1.7 10*3/uL (ref 0.7–4.0)
MCHC: 34.7 g/dL (ref 30.0–36.0)
MCV: 92.6 fl (ref 78.0–100.0)
Monocytes Absolute: 0.6 10*3/uL (ref 0.1–1.0)
Monocytes Relative: 8.5 % (ref 3.0–12.0)
Neutro Abs: 5 10*3/uL (ref 1.4–7.7)
Neutrophils Relative %: 66.2 % (ref 43.0–77.0)
Platelets: 192 10*3/uL (ref 150.0–400.0)
RBC: 5.18 Mil/uL (ref 4.22–5.81)
RDW: 12.9 % (ref 11.5–15.5)
WBC: 7.5 10*3/uL (ref 4.0–10.5)

## 2018-03-11 LAB — URINALYSIS, ROUTINE W REFLEX MICROSCOPIC
Bilirubin Urine: NEGATIVE
Hgb urine dipstick: NEGATIVE
Ketones, ur: NEGATIVE
Leukocytes,Ua: NEGATIVE
Nitrite: NEGATIVE
RBC / HPF: NONE SEEN (ref 0–?)
Specific Gravity, Urine: 1.025 (ref 1.000–1.030)
Total Protein, Urine: NEGATIVE
Urine Glucose: NEGATIVE
Urobilinogen, UA: 0.2 (ref 0.0–1.0)
pH: 5.5 (ref 5.0–8.0)

## 2018-03-11 LAB — LIPID PANEL
Cholesterol: 142 mg/dL (ref 0–200)
HDL: 37.5 mg/dL — ABNORMAL LOW (ref >39.00)
LDL Cholesterol: 78 mg/dL (ref 0–99)
NonHDL: 104.57
Total CHOL/HDL Ratio: 4
Triglycerides: 133 mg/dL (ref 0.0–149.0)
VLDL: 26.6 mg/dL (ref 0.0–40.0)

## 2018-03-11 LAB — HEPATIC FUNCTION PANEL
ALT: 51 U/L (ref 0–53)
AST: 30 U/L (ref 0–37)
Albumin: 4.9 g/dL (ref 3.5–5.2)
Alkaline Phosphatase: 79 U/L (ref 39–117)
Bilirubin, Direct: 0.2 mg/dL (ref 0.0–0.3)
Total Bilirubin: 1.2 mg/dL (ref 0.2–1.2)
Total Protein: 7.2 g/dL (ref 6.0–8.3)

## 2018-03-11 LAB — HEMOGLOBIN A1C: Hgb A1c MFr Bld: 5.6 % (ref 4.6–6.5)

## 2018-03-11 LAB — BASIC METABOLIC PANEL
BUN: 15 mg/dL (ref 6–23)
CO2: 26 mEq/L (ref 19–32)
Calcium: 10 mg/dL (ref 8.4–10.5)
Chloride: 104 mEq/L (ref 96–112)
Creatinine, Ser: 1.05 mg/dL (ref 0.40–1.50)
GFR: 70.24 mL/min (ref >60.00)
Glucose, Bld: 109 mg/dL — ABNORMAL HIGH (ref 70–99)
Potassium: 4.1 mEq/L (ref 3.5–5.1)
Sodium: 140 mEq/L (ref 135–145)

## 2018-03-11 LAB — PSA: PSA: 0.65 ng/mL (ref 0.10–4.00)

## 2018-03-11 LAB — TSH: TSH: 1.57 u[IU]/mL (ref 0.35–4.50)

## 2018-03-11 MED ORDER — CYCLOBENZAPRINE HCL 5 MG PO TABS: 5.0000 mg | ORAL_TABLET | Freq: Three times a day (TID) | ORAL | 1 refills | Status: AC | PRN

## 2018-03-11 NOTE — Assessment & Plan Note (Signed)
Ok to cont tramadol or naproxyn, for flexeril 5 tid prn, dont believe he needs films today,  to f/u any worsening symptoms or concerns

## 2018-03-11 NOTE — Assessment & Plan Note (Signed)
stable overall by history and exam, recent data reviewed with pt, and pt to continue medical treatment as before,  to f/u any worsening symptoms or concerns  

## 2018-03-11 NOTE — Patient Instructions (Addendum)
You had the Prevnar 13 pneumonia shot today  Please take all new medication as prescribed - the flexeril muscle relaxer as needed  Please continue all other medications as before, and refills have been done if requested.  Please have the pharmacy call with any other refills you may need.  Please continue your efforts at being more active, low cholesterol diet, and weight control.  You are otherwise up to date with prevention measures today.  Please keep your appointments with your specialists as you may have planned  You will be contacted regarding the referral for: colonoscopy  Please go to the LAB in the Basement (turn left off the elevator) for the tests to be done today  You will be contacted by phone if any changes need to be made immediately.  Otherwise, you will receive a letter about your results with an explanation, but please check with MyChart first.  Please remember to sign up for MyChart if you have not done so, as this will be important to you in the future with finding out test results, communicating by private email, and scheduling acute appointments online when needed.  Please return in 1 year for your yearly visit, or sooner if needed, with Lab testing done 3-5 days before  OK to cancel the Mar 26 appt

## 2018-03-11 NOTE — Addendum Note (Signed)
Addended by: Juliet Rude on: 03/11/2018 03:48 PM   Modules accepted: Orders

## 2018-03-11 NOTE — Progress Notes (Signed)
Subjective:    Patient ID: Mark Wilson, male    DOB: 03/11/1950, 68 y.o.   MRN: [id number]  HPI  Here for wellness and f/u;  Overall doing ok;  Pt denies Chest pain, worsening SOB, DOE, wheezing, orthopnea, PND, worsening LE edema, palpitations, dizziness or syncope.  Pt denies neurological change such as new headache, facial or extremity weakness.  Pt denies polydipsia, polyuria, or low sugar symptoms. Pt states overall good compliance with treatment and medications, good tolerability, and has been trying to follow appropriate diet.  Pt denies worsening depressive symptoms, suicidal ideation or panic. No fever, night sweats, wt loss, loss of appetite, or other constitutional symptoms.  Pt states good ability with ADL's, has low fall risk, home safety reviewed and adequate, no other significant changes in hearing or vision, and only occasionally active with exercise. ALso, Hit the head with leaning into the back of the car, heard a pop in the neck, then mod to severe pain, using hot and cold pack, swelling improved, and pain some improved except at night, tramadol help some.  Pt denies new neurological symptoms such as new headache, or facial or extremity weakness or numbness.  Already taking the naproxen on a regular bassis, Has not tried muscle relaxer.  Missed work x 2 days last wk.   Past Medical History:  Diagnosis Date  . ALLERGIC RHINITIS 02/12/2008  . ASTHMA UNSPECIFIED WITH EXACERBATION 02/12/2008  . HYPERLIPIDEMIA 02/12/2008  . Impaired glucose tolerance 12/28/2010   Past Surgical History:  Procedure Laterality Date  . TONSILLECTOMY      reports that he has been smoking cigars. He uses smokeless tobacco. He reports current alcohol use of about 8.0 standard drinks of alcohol per week. He reports that he does not use drugs. family history includes Breast cancer in his maternal grandmother; Depression in his mother; Diabetes in his maternal grandmother; Hypertension in his mother. Allergies    Allergen Reactions  . Penicillins Shortness Of Breath   Current Outpatient Medications on File Prior to Visit  Medication Sig Dispense Refill  . albuterol (PROAIR HFA) 108 (90 Base) MCG/ACT inhaler Inhale 2 puffs into the lungs every 6 (six) hours as needed for wheezing or shortness of breath. 8.5 g 5  . aspirin 81 MG tablet Take 81 mg by mouth daily.      Marland Kitchen atorvastatin (LIPITOR) 10 MG tablet TAKE 1 TABLET(10 MG) BY MOUTH DAILY 90 tablet 2  . atorvastatin (LIPITOR) 20 MG tablet Take 1 tablet (20 mg total) by mouth daily. 90 tablet 3  . fish oil-omega-3 fatty acids 1000 MG capsule Take 1,200 mg by mouth daily.    . Multiple Vitamins-Minerals (CENTRUM SILVER PO) Take 1 tablet by mouth daily.    . naproxen (NAPROSYN) 500 MG tablet Take 1 tablet (500 mg total) by mouth 2 (two) times daily. As needed 180 tablet 3  . omeprazole (PRILOSEC) 20 MG capsule TAKE 1 CAPSULE BY MOUTH TWICE DAILY WITH A MEAL 180 capsule 3  . omeprazole (PRILOSEC) 20 MG capsule TAKE 1 CAPSULE BY MOUTH TWICE DAILY WITH A MEAL 180 capsule 2  . traMADol (ULTRAM) 50 MG tablet Take 1 tablet (50 mg total) by mouth every 8 (eight) hours as needed. 120 tablet 2   No current facility-administered medications on file prior to visit.    Review of Systems Constitutional: Negative for other unusual diaphoresis, sweats, appetite or weight changes HENT: Negative for other worsening hearing loss, ear pain, facial swelling, mouth sores or neck stiffness.  Eyes: Negative for other worsening pain, redness or other visual disturbance.  Respiratory: Negative for other stridor or swelling Cardiovascular: Negative for other palpitations or other chest pain  Gastrointestinal: Negative for worsening diarrhea or loose stools, blood in stool, distention or other pain Genitourinary: Negative for hematuria, flank pain or other change in urine volume.  Musculoskeletal: Negative for myalgias or other joint swelling.  Skin: Negative for other color  change, or other wound or worsening drainage.  Neurological: Negative for other syncope or numbness. Hematological: Negative for other adenopathy or swelling Psychiatric/Behavioral: Negative for hallucinations, other worsening agitation, SI, self-injury, or new decreased concentration All other system neg per pt    Objective:   Physical Exam BP 134/88   Pulse 92   Temp 97.6 F (36.4 C) (Oral)   Ht 6' (1.829 m)   Wt 223 lb (101.2 kg)   SpO2 95%   BMI 30.24 kg/m  VS noted,  Constitutional: Pt is oriented to person, place, and time. Appears well-developed and well-nourished, in no significant distress and comfortable Head: Normocephalic and atraumatic  Eyes: Conjunctivae and EOM are normal. Pupils are equal, round, and reactive to light Right Ear: External ear normal without discharge Left Ear: External ear normal without discharge Nose: Nose without discharge or deformity Mouth/Throat: Oropharynx is without other ulcerations and moist  Neck: Normal range of motion. Neck supple. No JVD present. No tracheal deviation present or significant neck LA or mass Cardiovascular: Normal rate, regular rhythm, normal heart sounds and intact distal pulses.   Pulmonary/Chest: WOB normal and breath sounds without rales or wheezing  Abdominal: Soft. Bowel sounds are normal. NT. No HSM  Musculoskeletal: Normal range of motion. Exhibits no edema Lymphadenopathy: Has no other cervical adenopathy.  Neurological: Pt is alert and oriented to person, place, and time. Pt has normal reflexes. No cranial nerve deficit. Motor grossly intact, Gait intact Skin: Skin is warm and dry. No rash noted or new ulcerations Psychiatric:  Has normal mood and affect. Behavior is normal without agitation No other exam findings Lab Results  Component Value Date   WBC 5.2 03/21/2017   HGB 16.3 03/21/2017   HCT 47.1 03/21/2017   PLT 170.0 03/21/2017   GLUCOSE 137 (H) 03/21/2017   CHOL 170 03/21/2017   TRIG 175.0 (H)  03/21/2017   HDL 38.70 (L) 03/21/2017   LDLDIRECT 119.0 03/14/2016   LDLCALC 96 03/21/2017   ALT 38 03/21/2017   AST 29 03/21/2017   NA 139 03/21/2017   K 4.9 03/21/2017   CL 103 03/21/2017   CREATININE 1.14 03/21/2017   BUN 16 03/21/2017   CO2 26 03/21/2017   TSH 1.28 03/21/2017   PSA 0.81 03/21/2017   HGBA1C 5.5 03/21/2017       Assessment & Plan:

## 2018-03-11 NOTE — Assessment & Plan Note (Signed)

## 2018-03-27 ENCOUNTER — Encounter: Payer: 59 | Admitting: Internal Medicine

## 2018-04-03 ENCOUNTER — Other Ambulatory Visit: Payer: Self-pay | Admitting: Internal Medicine

## 2018-04-12 ENCOUNTER — Other Ambulatory Visit: Payer: Self-pay | Admitting: Internal Medicine

## 2018-05-02 ENCOUNTER — Encounter: Payer: Self-pay | Admitting: Internal Medicine

## 2018-06-30 ENCOUNTER — Other Ambulatory Visit: Payer: Self-pay

## 2018-06-30 MED ORDER — ATORVASTATIN CALCIUM 10 MG PO TABS: ORAL_TABLET | ORAL | 1 refills | Status: DC

## 2018-07-11 ENCOUNTER — Other Ambulatory Visit: Payer: Self-pay | Admitting: Internal Medicine

## 2018-09-20 ENCOUNTER — Ambulatory Visit (INDEPENDENT_AMBULATORY_CARE_PROVIDER_SITE_OTHER): Payer: 59

## 2018-09-20 DIAGNOSIS — Z23 Encounter for immunization: Secondary | ICD-10-CM

## 2018-10-12 ENCOUNTER — Encounter: Payer: Self-pay | Admitting: Internal Medicine

## 2018-10-14 ENCOUNTER — Telehealth: Payer: Self-pay | Admitting: Internal Medicine

## 2018-10-14 NOTE — Telephone Encounter (Signed)
Called patient's wife and informed.

## 2018-10-14 NOTE — Telephone Encounter (Signed)
Patient's wife called asking if patient is due for his pneumonia shot this year.  He had Pneumococcal Polysaccharide-23 on 11/01/2015 and Pneumococcal Conjugate-13 on 03/11/2018.  Please advise.

## 2018-10-14 NOTE — Telephone Encounter (Signed)
Copied from [place] [id number]. Topic: General - Other >> Oct 14, 2018 11:41 AM Rayann Heman wrote: Reason for CRM: pt wife called and would like to know if pt needs to get the pneumonia shot this year. Please advise

## 2018-10-14 NOTE — Telephone Encounter (Signed)
Already answered oct 11  No.

## 2018-10-21 ENCOUNTER — Other Ambulatory Visit: Payer: Self-pay

## 2018-10-21 MED ORDER — OMEPRAZOLE 20 MG PO CPDR: 20.0000 mg | DELAYED_RELEASE_CAPSULE | Freq: Two times a day (BID) | ORAL | 1 refills | Status: DC

## 2019-02-03 ENCOUNTER — Other Ambulatory Visit: Payer: Self-pay | Admitting: Internal Medicine

## 2019-02-10 ENCOUNTER — Ambulatory Visit: Payer: PRIVATE HEALTH INSURANCE | Admitting: Family

## 2019-02-10 ENCOUNTER — Encounter: Payer: Self-pay | Admitting: Family

## 2019-02-10 ENCOUNTER — Other Ambulatory Visit: Payer: Self-pay

## 2019-02-10 VITALS — BP 140/88 | HR 79 | Temp 98.2°F | Ht 72.0 in | Wt 233.0 lb

## 2019-02-10 DIAGNOSIS — R300 Dysuria: Secondary | ICD-10-CM

## 2019-02-10 LAB — POC URINALSYSI DIPSTICK (AUTOMATED)
Bilirubin, UA: NEGATIVE
Blood, UA: POSITIVE
Glucose, UA: NEGATIVE
Ketones, UA: NEGATIVE
Leukocytes, UA: NEGATIVE
Nitrite, UA: NEGATIVE
Protein, UA: POSITIVE — AB
Spec Grav, UA: >=1.03 — AB (ref 1.010–1.025)
Urobilinogen, UA: 0.2 E.U./dL
pH, UA: 5.5 (ref 5.0–8.0)

## 2019-02-10 MED ORDER — CIPROFLOXACIN HCL 500 MG PO TABS: 500.0000 mg | ORAL_TABLET | Freq: Two times a day (BID) | ORAL | 0 refills | Status: DC

## 2019-02-10 NOTE — Progress Notes (Signed)
Mark Wilson is a 69 y.o. male with the following history as recorded in Epic[employer]:  Patient Active Problem List   Diagnosis Date Noted  . Neck pain 03/11/2018  . Degenerative arthritis of knee, bilateral 03/14/2016  . History of colon polyps 03/14/2016  . Low back pain 03/14/2016  . Left rotator cuff tear 04/28/2014  . Left shoulder pain 04/27/2014  . Acute meniscal tear of left knee 08/04/2013  . Left knee pain 07/21/2013  . Right knee pain 03/10/2012  . Skin lesion 12/28/2010  . Impaired glucose tolerance 12/28/2010  . Encounter for well adult exam with abnormal findings 09/24/2010  . Hyperlipidemia 02/12/2008  . ALLERGIC RHINITIS 02/12/2008  . ASTHMA 02/12/2008    Current Outpatient Medications  Medication Sig Dispense Refill  . albuterol (PROAIR HFA) 108 (90 Base) MCG/ACT inhaler Inhale 2 puffs into the lungs every 6 (six) hours as needed for wheezing or shortness of breath. 8.5 g 5  . aspirin 81 MG tablet Take 81 mg by mouth daily.      Marland Kitchen atorvastatin (LIPITOR) 20 MG tablet TAKE 1 TABLET(20 MG) BY MOUTH DAILY 90 tablet 0  . cyclobenzaprine (FLEXERIL) 5 MG tablet Take 1 tablet (5 mg total) by mouth 3 (three) times daily as needed for muscle spasms. 40 tablet 1  . fish oil-omega-3 fatty acids 1000 MG capsule Take 1,200 mg by mouth daily.    . Multiple Vitamins-Minerals (CENTRUM SILVER PO) Take 1 tablet by mouth daily.    . naproxen (NAPROSYN) 500 MG tablet TAKE 1 TABLET BY MOUTH TWICE DAILY AS NEEDED 180 tablet 3  . omeprazole (PRILOSEC) 20 MG capsule Take 1 capsule (20 mg total) by mouth 2 (two) times daily with a meal. 180 capsule 1  . traMADol (ULTRAM) 50 MG tablet Take 1 tablet (50 mg total) by mouth every 8 (eight) hours as needed. 120 tablet 2  . ciprofloxacin (CIPRO) 500 MG tablet Take 1 tablet (500 mg total) by mouth 2 (two) times daily. 10 tablet 0   No current facility-administered medications for this visit.    Allergies: Penicillins  Past Medical History:  Diagnosis  Date  . ALLERGIC RHINITIS 02/12/2008  . ASTHMA UNSPECIFIED WITH EXACERBATION 02/12/2008  . HYPERLIPIDEMIA 02/12/2008  . Impaired glucose tolerance 12/28/2010    Past Surgical History:  Procedure Laterality Date  . TONSILLECTOMY      Family History  Problem Relation Age of Onset  . Hypertension Mother   . Depression Mother   . Breast cancer Maternal Grandmother   . Diabetes Maternal Grandmother   . Colon cancer Neg Hx   . Esophageal cancer Neg Hx   . Stomach cancer Neg Hx   . Rectal cancer Neg Hx     Social History   Tobacco Use  . Smoking status: Current Some Day Smoker    Types: Cigars  . Smokeless tobacco: Current User  Substance Use Topics  . Alcohol use: Yes    Alcohol/week: 8.0 standard drinks    Types: 8 Cans of beer per week    Subjective:  Patient has been having increased burning/ frequency with urination for the past 4 days; no fever, no blood in urine; not prone to UTIs; admits he has not been drinking as much water as he needs to recently;     Objective:  Vitals:   02/10/19 1000  BP: 140/88  Pulse: 79  Temp: 98.2 F (36.8 C)  TempSrc: Oral  SpO2: 95%  Weight: 233 lb (105.7 kg)  Height: 6' (  1.829 m)    General: Well developed, well nourished, in no acute distress  Skin : Warm and dry.  Head: Normocephalic and atraumatic  Lungs: Respirations unlabored;  Neurologic: Alert and oriented; speech intact; face symmetrical;   Assessment:  1. Dysuria     Plan:  Suspect UTI and/or prostate infection; check U/A and urine culture today; Rx for Cipro 500 mg bid x 5 days; increase fluids, rest and follow-up to be determined;  This visit occurred during the SARS-CoV-2 public health emergency.  Safety protocols were in place, including screening questions prior to the visit, additional usage of staff PPE, and extensive cleaning of exam room while observing appropriate contact time as indicated for disinfecting solutions.     No follow-ups on file.  Orders  Placed This Encounter  Procedures  . Urine Culture    Standing Status:   Future    Number of Occurrences:   1    Standing Expiration Date:   02/10/2020    Requested Prescriptions   Signed Prescriptions Disp Refills  . ciprofloxacin (CIPRO) 500 MG tablet 10 tablet 0    Sig: Take 1 tablet (500 mg total) by mouth 2 (two) times daily.

## 2019-02-10 NOTE — Addendum Note (Signed)
Addended by: Marcina Millard on: 02/10/2019 02:27 PM   Modules accepted: Orders

## 2019-02-12 LAB — URINE CULTURE

## 2019-02-13 ENCOUNTER — Other Ambulatory Visit: Payer: Self-pay | Admitting: Family

## 2019-02-13 MED ORDER — NITROFURANTOIN MONOHYD MACRO 100 MG PO CAPS: 100.0000 mg | ORAL_CAPSULE | Freq: Two times a day (BID) | ORAL | 0 refills | Status: DC

## 2019-04-08 ENCOUNTER — Telehealth: Payer: Self-pay | Admitting: Internal Medicine

## 2019-04-08 MED ORDER — ATORVASTATIN CALCIUM 20 MG PO TABS: ORAL_TABLET | ORAL | 3 refills | Status: DC

## 2019-04-08 NOTE — Telephone Encounter (Signed)
Reviewed chart pt is up-to-date sent refills to [facility].../lmb  

## 2019-04-08 NOTE — Telephone Encounter (Signed)
   1.Medication Requested:atorvastatin (LIPITOR) 20 MG tablet  2. Pharmacy (Name, Street, City):[facility] [place], [address]  3. On Med List: yes  4. Last Visit with PCP:   5. Next visit date with PCP: 04/21/19   Agent: Please be advised that RX refills may take up to 3 business days. We ask that you follow-up with your pharmacy.

## 2019-04-18 ENCOUNTER — Other Ambulatory Visit: Payer: Self-pay | Admitting: Internal Medicine

## 2019-04-18 NOTE — Telephone Encounter (Signed)
Please refill as per office routine med refill policy (all routine meds refilled for 3 mo or monthly per pt preference up to one year from last visit, then month to month grace period for 3 mo, then further med refills will have to be denied)  

## 2019-04-21 ENCOUNTER — Ambulatory Visit: Payer: PRIVATE HEALTH INSURANCE | Admitting: Internal Medicine

## 2019-04-21 ENCOUNTER — Encounter: Payer: Self-pay | Admitting: Internal Medicine

## 2019-04-21 ENCOUNTER — Other Ambulatory Visit: Payer: Self-pay

## 2019-04-21 VITALS — BP 130/80 | HR 99 | Temp 97.9°F | Ht 72.0 in | Wt 236.0 lb

## 2019-04-21 DIAGNOSIS — Z0000 Encounter for general adult medical examination without abnormal findings: Secondary | ICD-10-CM

## 2019-04-21 DIAGNOSIS — E559 Vitamin D deficiency, unspecified: Secondary | ICD-10-CM | POA: Diagnosis not present

## 2019-04-21 DIAGNOSIS — E611 Iron deficiency: Secondary | ICD-10-CM

## 2019-04-21 DIAGNOSIS — R7302 Impaired glucose tolerance (oral): Secondary | ICD-10-CM

## 2019-04-21 DIAGNOSIS — E538 Deficiency of other specified B group vitamins: Secondary | ICD-10-CM | POA: Diagnosis not present

## 2019-04-21 MED ORDER — ATORVASTATIN CALCIUM 20 MG PO TABS: ORAL_TABLET | ORAL | 3 refills | Status: DC

## 2019-04-21 MED ORDER — OMEPRAZOLE 20 MG PO CPDR: 20.0000 mg | DELAYED_RELEASE_CAPSULE | Freq: Two times a day (BID) | ORAL | 3 refills | Status: DC

## 2019-04-21 MED ORDER — NAPROXEN 500 MG PO TABS: 500.0000 mg | ORAL_TABLET | Freq: Two times a day (BID) | ORAL | 3 refills | Status: DC | PRN

## 2019-04-21 NOTE — Patient Instructions (Addendum)
Please continue all other medications as before, and refills have been done if requested. ° °Please have the pharmacy call with any other refills you may need. ° °Please continue your efforts at being more active, low cholesterol diet, and weight control. ° °You are otherwise up to date with prevention measures today. ° °Please keep your appointments with your specialists as you may have planned ° °You will be contacted regarding the referral for: colonoscopy ° °Please go to the LAB at the blood drawing area for the tests to be done ° °You will be contacted by phone if any changes need to be made immediately.  Otherwise, you will receive a letter about your results with an explanation, but please check with MyChart first. ° °Please remember to sign up for MyChart if you have not done so, as this will be important to you in the future with finding out test results, communicating by private email, and scheduling acute appointments online when needed. ° °Please make an Appointment to return for your 1 year visit, or sooner if needed °

## 2019-04-21 NOTE — Progress Notes (Signed)
Subjective:    Patient ID: Mark Wilson, male    DOB: 07/30/1950, 69 y.o.   MRN: [id number]  HPI    Here for wellness and f/u;  Overall doing ok;  Pt denies Chest pain, worsening SOB, DOE, wheezing, orthopnea, PND, worsening LE edema, palpitations, dizziness or syncope.  Pt denies neurological change such as new headache, facial or extremity weakness.  Pt denies polydipsia, polyuria, or low sugar symptoms. Pt states overall good compliance with treatment and medications, good tolerability, and has been trying to follow appropriate diet.  Pt denies worsening depressive symptoms, suicidal ideation or panic. No fever, night sweats, wt loss, loss of appetite, or other constitutional symptoms.  Pt states good ability with ADL's, has low fall risk, home safety reviewed and adequate, no other significant changes in hearing or vision, and only occasionally active with exercise  S/p UTI about 2 mo ago, resolved.   Wt Readings from Last 3 Encounters:  04/21/19 236 lb (107 kg)  02/10/19 233 lb (105.7 kg)  03/11/18 223 lb (101.2 kg)   Past Medical History:  Diagnosis Date  . ALLERGIC RHINITIS 02/12/2008  . ASTHMA UNSPECIFIED WITH EXACERBATION 02/12/2008  . HYPERLIPIDEMIA 02/12/2008  . Impaired glucose tolerance 12/28/2010   Past Surgical History:  Procedure Laterality Date  . TONSILLECTOMY      reports that he has been smoking cigars. He uses smokeless tobacco. He reports current alcohol use of about 8.0 standard drinks of alcohol per week. He reports that he does not use drugs. family history includes Breast cancer in his maternal grandmother; Depression in his mother; Diabetes in his maternal grandmother; Hypertension in his mother. Allergies  Allergen Reactions  . Penicillins Shortness Of Breath   Current Outpatient Medications on File Prior to Visit  Medication Sig Dispense Refill  . albuterol (PROAIR HFA) 108 (90 Base) MCG/ACT inhaler Inhale 2 puffs into the lungs every 6 (six) hours as needed for  wheezing or shortness of breath. 8.5 g 5  . aspirin 81 MG tablet Take 81 mg by mouth daily.      . cyclobenzaprine (FLEXERIL) 5 MG tablet Take 1 tablet (5 mg total) by mouth 3 (three) times daily as needed for muscle spasms. 40 tablet 1  . fish oil-omega-3 fatty acids 1000 MG capsule Take 1,200 mg by mouth daily.    . Multiple Vitamins-Minerals (CENTRUM SILVER PO) Take 1 tablet by mouth daily.    . traMADol (ULTRAM) 50 MG tablet Take 1 tablet (50 mg total) by mouth every 8 (eight) hours as needed. 120 tablet 2   No current facility-administered medications on file prior to visit.   Review of Systems All otherwise neg per pt     Objective:   Physical Exam BP 130/80 (BP Location: Left Arm, Patient Position: Sitting, Cuff Size: Large)   Pulse 99   Temp 97.9 F (36.6 C) (Oral)   Ht 6' (1.829 m)   Wt 236 lb (107 kg)   SpO2 97%   BMI 32.01 kg/m  VS noted,  Constitutional: Pt appears in NAD HENT: Head: NCAT.  Right Ear: External ear normal.  Left Ear: External ear normal.  Eyes: . Pupils are equal, round, and reactive to light. Conjunctivae and EOM are normal Nose: without d/c or deformity Neck: Neck supple. Gross normal ROM Cardiovascular: Normal rate and regular rhythm.   Pulmonary/Chest: Effort normal and breath sounds without rales or wheezing.  Abd:  Soft, NT, ND, + BS, no organomegaly Neurological: Pt is alert. At baseline  orientation, motor grossly intact Skin: Skin is warm. No rashes, other new lesions, no LE edema Psychiatric: Pt behavior is normal without agitation  All otherwise neg per pt Lab Results  Component Value Date   WBC 7.5 03/11/2018   HGB 16.6 03/11/2018   HCT 47.9 03/11/2018   PLT 192.0 03/11/2018   GLUCOSE 109 (H) 03/11/2018   CHOL 142 03/11/2018   TRIG 133.0 03/11/2018   HDL 37.50 (L) 03/11/2018   LDLDIRECT 119.0 03/14/2016   LDLCALC 78 03/11/2018   ALT 51 03/11/2018   AST 30 03/11/2018   NA 140 03/11/2018   K 4.1 03/11/2018   CL 104 03/11/2018    CREATININE 1.05 03/11/2018   BUN 15 03/11/2018   CO2 26 03/11/2018   TSH 1.57 03/11/2018   PSA 0.65 03/11/2018   HGBA1C 5.6 03/11/2018      Assessment & Plan:

## 2019-04-21 NOTE — Assessment & Plan Note (Signed)
stable overall by history and exam, recent data reviewed with pt, and pt to continue medical treatment as before,  to f/u any worsening symptoms or concerns  

## 2019-04-21 NOTE — Assessment & Plan Note (Signed)

## 2019-04-22 LAB — BASIC METABOLIC PANEL
BUN: 15 mg/dL (ref 6–23)
CO2: 26 mEq/L (ref 19–32)
Calcium: 9.8 mg/dL (ref 8.4–10.5)
Chloride: 104 mEq/L (ref 96–112)
Creatinine, Ser: 1.02 mg/dL (ref 0.40–1.50)
GFR: 72.39 mL/min (ref >60.00)
Glucose, Bld: 100 mg/dL — ABNORMAL HIGH (ref 70–99)
Potassium: 4 mEq/L (ref 3.5–5.1)
Sodium: 139 mEq/L (ref 135–145)

## 2019-04-22 LAB — HEPATIC FUNCTION PANEL
ALT: 50 U/L (ref 0–53)
AST: 36 U/L (ref 0–37)
Albumin: 4.6 g/dL (ref 3.5–5.2)
Alkaline Phosphatase: 83 U/L (ref 39–117)
Bilirubin, Direct: 0.2 mg/dL (ref 0.0–0.3)
Total Bilirubin: 0.9 mg/dL (ref 0.2–1.2)
Total Protein: 6.9 g/dL (ref 6.0–8.3)

## 2019-04-22 LAB — LIPID PANEL
Cholesterol: 151 mg/dL (ref 0–200)
HDL: 36.4 mg/dL — ABNORMAL LOW (ref >39.00)
NonHDL: 114.28
Total CHOL/HDL Ratio: 4
Triglycerides: 242 mg/dL — ABNORMAL HIGH (ref 0.0–149.0)
VLDL: 48.4 mg/dL — ABNORMAL HIGH (ref 0.0–40.0)

## 2019-04-22 LAB — URINALYSIS, ROUTINE W REFLEX MICROSCOPIC
Bilirubin Urine: NEGATIVE
Hgb urine dipstick: NEGATIVE
Ketones, ur: NEGATIVE
Leukocytes,Ua: NEGATIVE
Nitrite: NEGATIVE
RBC / HPF: NONE SEEN (ref 0–?)
Specific Gravity, Urine: 1.025 (ref 1.000–1.030)
Total Protein, Urine: NEGATIVE
Urine Glucose: NEGATIVE
Urobilinogen, UA: 2 — AB (ref 0.0–1.0)
pH: 6.5 (ref 5.0–8.0)

## 2019-04-22 LAB — CBC WITH DIFFERENTIAL/PLATELET
Basophils Absolute: 0 10*3/uL (ref 0.0–0.1)
Basophils Relative: 0.5 % (ref 0.0–3.0)
Eosinophils Absolute: 0.1 10*3/uL (ref 0.0–0.7)
Eosinophils Relative: 1.7 % (ref 0.0–5.0)
HCT: 45.4 % (ref 39.0–52.0)
Hemoglobin: 15.2 g/dL (ref 13.0–17.0)
Lymphocytes Relative: 13.9 % (ref 12.0–46.0)
Lymphs Abs: 0.9 10*3/uL (ref 0.7–4.0)
MCHC: 33.6 g/dL (ref 30.0–36.0)
MCV: 95.6 fl (ref 78.0–100.0)
Monocytes Absolute: 0.6 10*3/uL (ref 0.1–1.0)
Monocytes Relative: 9.9 % (ref 3.0–12.0)
Neutro Abs: 4.6 10*3/uL (ref 1.4–7.7)
Neutrophils Relative %: 74 % (ref 43.0–77.0)
Platelets: 177 10*3/uL (ref 150.0–400.0)
RBC: 4.75 Mil/uL (ref 4.22–5.81)
RDW: 13.6 % (ref 11.5–15.5)
WBC: 6.2 10*3/uL (ref 4.0–10.5)

## 2019-04-22 LAB — IBC PANEL
Iron: 46 ug/dL (ref 42–165)
Saturation Ratios: 10.7 % — ABNORMAL LOW (ref 20.0–50.0)
Transferrin: 308 mg/dL (ref 212.0–360.0)

## 2019-04-22 LAB — HEMOGLOBIN A1C: Hgb A1c MFr Bld: 5.6 % (ref 4.6–6.5)

## 2019-04-22 LAB — TSH: TSH: 2.51 u[IU]/mL (ref 0.35–4.50)

## 2019-04-22 LAB — PSA: PSA: 0.98 ng/mL (ref 0.10–4.00)

## 2019-04-22 LAB — LDL CHOLESTEROL, DIRECT: Direct LDL: 86 mg/dL

## 2019-04-22 LAB — VITAMIN D 25 HYDROXY (VIT D DEFICIENCY, FRACTURES): VITD: 36.87 ng/mL (ref 30.00–100.00)

## 2019-04-22 LAB — VITAMIN B12: Vitamin B-12: 356 pg/mL (ref 211–911)

## 2019-10-09 ENCOUNTER — Other Ambulatory Visit: Payer: Self-pay

## 2019-10-09 ENCOUNTER — Ambulatory Visit (INDEPENDENT_AMBULATORY_CARE_PROVIDER_SITE_OTHER): Payer: PRIVATE HEALTH INSURANCE | Admitting: *Deleted

## 2019-10-09 DIAGNOSIS — Z23 Encounter for immunization: Secondary | ICD-10-CM | POA: Diagnosis not present

## 2020-04-21 ENCOUNTER — Other Ambulatory Visit: Payer: Self-pay | Admitting: Internal Medicine

## 2020-04-21 MED ORDER — NAPROXEN 500 MG PO TABS: 500.0000 mg | ORAL_TABLET | Freq: Two times a day (BID) | ORAL | 0 refills | Status: DC | PRN

## 2020-06-08 ENCOUNTER — Telehealth: Payer: Self-pay | Admitting: Internal Medicine

## 2020-06-08 ENCOUNTER — Other Ambulatory Visit: Payer: Self-pay | Admitting: Internal Medicine

## 2020-06-08 MED ORDER — ATORVASTATIN CALCIUM 20 MG PO TABS: ORAL_TABLET | ORAL | 0 refills | Status: DC

## 2020-06-08 MED ORDER — OMEPRAZOLE 20 MG PO CPDR: 20.0000 mg | DELAYED_RELEASE_CAPSULE | Freq: Two times a day (BID) | ORAL | 0 refills | Status: DC

## 2020-06-08 NOTE — Telephone Encounter (Signed)
1.Medication Requested: atorvastatin (LIPITOR) 20 MG tablet  omeprazole (PRILOSEC) 20 MG capsule    2. Pharmacy (Name, Street, [place]): [facility], [address]  3. On Med List: yes   4. Last Visit with PCP: 04-21-19  5. Next visit date with PCP: n/a    Agent: Please be advised that RX refills may take up to 3 business days. We ask that you follow-up with your pharmacy.

## 2020-06-28 ENCOUNTER — Other Ambulatory Visit: Payer: Self-pay | Admitting: Internal Medicine

## 2020-06-28 ENCOUNTER — Telehealth: Payer: Self-pay | Admitting: Internal Medicine

## 2020-06-28 DIAGNOSIS — E559 Vitamin D deficiency, unspecified: Secondary | ICD-10-CM

## 2020-06-28 DIAGNOSIS — E611 Iron deficiency: Secondary | ICD-10-CM

## 2020-06-28 DIAGNOSIS — R7302 Impaired glucose tolerance (oral): Secondary | ICD-10-CM

## 2020-06-28 DIAGNOSIS — Z0000 Encounter for general adult medical examination without abnormal findings: Secondary | ICD-10-CM

## 2020-06-28 DIAGNOSIS — E538 Deficiency of other specified B group vitamins: Secondary | ICD-10-CM

## 2020-06-28 NOTE — Telephone Encounter (Signed)
Patient is scheduled for his CPE on 07/15/2020 and is requesting his labs to be order before the appointment.   Please advise.

## 2020-06-28 NOTE — Telephone Encounter (Signed)
Ok labs done 

## 2020-06-28 NOTE — Telephone Encounter (Signed)
Left voicemail that labs have been ordered.

## 2020-06-28 NOTE — Telephone Encounter (Signed)
Ok. Labs ordered.

## 2020-07-08 ENCOUNTER — Other Ambulatory Visit (INDEPENDENT_AMBULATORY_CARE_PROVIDER_SITE_OTHER): Payer: No Typology Code available for payment source

## 2020-07-08 ENCOUNTER — Other Ambulatory Visit: Payer: Self-pay

## 2020-07-08 DIAGNOSIS — E559 Vitamin D deficiency, unspecified: Secondary | ICD-10-CM

## 2020-07-08 DIAGNOSIS — Z0000 Encounter for general adult medical examination without abnormal findings: Secondary | ICD-10-CM | POA: Diagnosis not present

## 2020-07-08 DIAGNOSIS — R7302 Impaired glucose tolerance (oral): Secondary | ICD-10-CM

## 2020-07-08 DIAGNOSIS — E538 Deficiency of other specified B group vitamins: Secondary | ICD-10-CM

## 2020-07-08 LAB — CBC WITH DIFFERENTIAL/PLATELET
Basophils Absolute: 0.1 10*3/uL (ref 0.0–0.1)
Basophils Relative: 0.9 % (ref 0.0–3.0)
Eosinophils Absolute: 0.3 10*3/uL (ref 0.0–0.7)
Eosinophils Relative: 5.9 % — ABNORMAL HIGH (ref 0.0–5.0)
HCT: 43.6 % (ref 39.0–52.0)
Hemoglobin: 15 g/dL (ref 13.0–17.0)
Lymphocytes Relative: 26.7 % (ref 12.0–46.0)
Lymphs Abs: 1.4 10*3/uL (ref 0.7–4.0)
MCHC: 34.5 g/dL (ref 30.0–36.0)
MCV: 94.1 fl (ref 78.0–100.0)
Monocytes Absolute: 0.6 10*3/uL (ref 0.1–1.0)
Monocytes Relative: 10.9 % (ref 3.0–12.0)
Neutro Abs: 3 10*3/uL (ref 1.4–7.7)
Neutrophils Relative %: 55.6 % (ref 43.0–77.0)
Platelets: 165 10*3/uL (ref 150.0–400.0)
RBC: 4.63 Mil/uL (ref 4.22–5.81)
RDW: 13.3 % (ref 11.5–15.5)
WBC: 5.3 10*3/uL (ref 4.0–10.5)

## 2020-07-08 LAB — URINALYSIS, ROUTINE W REFLEX MICROSCOPIC
Bilirubin Urine: NEGATIVE
Hgb urine dipstick: NEGATIVE
Ketones, ur: NEGATIVE
Leukocytes,Ua: NEGATIVE
Nitrite: NEGATIVE
RBC / HPF: NONE SEEN (ref 0–?)
Specific Gravity, Urine: 1.025 (ref 1.000–1.030)
Urine Glucose: NEGATIVE
Urobilinogen, UA: 1 (ref 0.0–1.0)
pH: 6 (ref 5.0–8.0)

## 2020-07-08 LAB — BASIC METABOLIC PANEL
BUN: 18 mg/dL (ref 6–23)
CO2: 27 mEq/L (ref 19–32)
Calcium: 9.2 mg/dL (ref 8.4–10.5)
Chloride: 104 mEq/L (ref 96–112)
Creatinine, Ser: 1.02 mg/dL (ref 0.40–1.50)
GFR: 74.57 mL/min (ref >60.00)
Glucose, Bld: 146 mg/dL — ABNORMAL HIGH (ref 70–99)
Potassium: 4.4 mEq/L (ref 3.5–5.1)
Sodium: 139 mEq/L (ref 135–145)

## 2020-07-08 LAB — LIPID PANEL
Cholesterol: 136 mg/dL (ref 0–200)
HDL: 35.2 mg/dL — ABNORMAL LOW (ref >39.00)
LDL Cholesterol: 82 mg/dL (ref 0–99)
NonHDL: 100.73
Total CHOL/HDL Ratio: 4
Triglycerides: 93 mg/dL (ref 0.0–149.0)
VLDL: 18.6 mg/dL (ref 0.0–40.0)

## 2020-07-08 LAB — HEPATIC FUNCTION PANEL
ALT: 38 U/L (ref 0–53)
AST: 28 U/L (ref 0–37)
Albumin: 4.4 g/dL (ref 3.5–5.2)
Alkaline Phosphatase: 73 U/L (ref 39–117)
Bilirubin, Direct: 0.2 mg/dL (ref 0.0–0.3)
Total Bilirubin: 0.8 mg/dL (ref 0.2–1.2)
Total Protein: 6.5 g/dL (ref 6.0–8.3)

## 2020-07-08 LAB — VITAMIN D 25 HYDROXY (VIT D DEFICIENCY, FRACTURES): VITD: 37.55 ng/mL (ref 30.00–100.00)

## 2020-07-08 LAB — VITAMIN B12: Vitamin B-12: 288 pg/mL (ref 211–911)

## 2020-07-08 LAB — TSH: TSH: 1.3 u[IU]/mL (ref 0.35–5.50)

## 2020-07-08 LAB — HEMOGLOBIN A1C: Hgb A1c MFr Bld: 5.8 % (ref 4.6–6.5)

## 2020-07-08 LAB — PSA: PSA: 0.88 ng/mL (ref 0.10–4.00)

## 2020-07-15 ENCOUNTER — Ambulatory Visit (INDEPENDENT_AMBULATORY_CARE_PROVIDER_SITE_OTHER): Payer: No Typology Code available for payment source | Admitting: Internal Medicine

## 2020-07-15 ENCOUNTER — Encounter: Payer: Self-pay | Admitting: Internal Medicine

## 2020-07-15 ENCOUNTER — Other Ambulatory Visit: Payer: Self-pay

## 2020-07-15 VITALS — BP 130/82 | HR 80 | Temp 97.7°F | Ht 72.0 in | Wt 224.0 lb

## 2020-07-15 DIAGNOSIS — J309 Allergic rhinitis, unspecified: Secondary | ICD-10-CM | POA: Diagnosis not present

## 2020-07-15 DIAGNOSIS — E7800 Pure hypercholesterolemia, unspecified: Secondary | ICD-10-CM | POA: Diagnosis not present

## 2020-07-15 DIAGNOSIS — R7302 Impaired glucose tolerance (oral): Secondary | ICD-10-CM

## 2020-07-15 DIAGNOSIS — Z0001 Encounter for general adult medical examination with abnormal findings: Secondary | ICD-10-CM

## 2020-07-15 DIAGNOSIS — Z86010 Personal history of colonic polyps: Secondary | ICD-10-CM

## 2020-07-15 DIAGNOSIS — E538 Deficiency of other specified B group vitamins: Secondary | ICD-10-CM

## 2020-07-15 DIAGNOSIS — J4520 Mild intermittent asthma, uncomplicated: Secondary | ICD-10-CM | POA: Diagnosis not present

## 2020-07-15 DIAGNOSIS — E559 Vitamin D deficiency, unspecified: Secondary | ICD-10-CM | POA: Diagnosis not present

## 2020-07-15 NOTE — Patient Instructions (Addendum)
You will be contacted regarding the referral for: colonoscopy (in network)  Please check with your insurance about the Shingrix shingle shot coverage; then you can return here or go to [facility] or similar to have the shot done  We have discussed the Cardiac CT Score test to measure the calcification level (if any) in your heart arteries.  This test has been ordered in our [facility] [place], so please call [facility] directly, as they prefer this, at [phone] to be scheduled.  Please continue all other medications as before, and refills have been done if requested.  Please have the pharmacy call with any other refills you may need.  Please continue your efforts at being more active, low cholesterol diet, and weight control.  You are otherwise up to date with prevention measures today.  Please keep your appointments with your specialists as you may have planned  Please make an Appointment to return for your 1 year visit, or sooner if needed, with Lab testing by Appointment as well, to be done about 3-5 days before at the [facility] (so this is for TWO appointments - please see the scheduling desk as you leave)  Due to the ongoing Covid 19 pandemic, our lab now requires an appointment for any labs done at our office.  If you need labs done and do not have an appointment, please call our office ahead of time to schedule before presenting to the lab for your testing.

## 2020-07-15 NOTE — Progress Notes (Signed)
Patient ID: Mark Wilson, male   DOB: [birth year], 70 y.o.   MRN: [id number]         Chief Complaint:: wellness exam and hyperglycemia, hld, asthma, allergies       HPI:  Mark Wilson is a 70 y.o. male here for wellness exam; decliens shingrix vax, due for colonoscopy, ow up to date with preventive referrals and immunizations                        Also still working full time as Therapist, music x 22 yrs.  Pt denies chest pain, increased sob or doe, wheezing, orthopnea, PND, increased LE swelling, palpitations, dizziness or syncope.   Pt denies polydipsia, polyuria, or new focal neuro s/s.  Is interested in Cardiac Ct score to r/o CAD and need for further statin.   Pt denies fever, wt loss, night sweats, loss of appetite, or other constitutional symptoms  No other new complaints  Does have several wks ongoing nasal allergy symptoms with clearish congestion, itch and sneezing, without fever, pain, ST, cough, swelling or wheezing. Wt Readings from Last 3 Encounters:  07/15/20 224 lb (101.6 kg)  04/21/19 236 lb (107 kg)  02/10/19 233 lb (105.7 kg)   BP Readings from Last 3 Encounters:  07/15/20 130/82  04/21/19 130/80  02/10/19 140/88   Immunization History  Administered Date(s) Administered   Fluad Quad(high Dose 65+) 09/20/2018, 10/09/2019   H1N1 02/12/2008   Influenza Split 09/25/2010, 11/05/2011   Influenza Whole 10/02/2007, 11/21/2009   Influenza, High Dose Seasonal PF 09/25/2016, 09/17/2017   Influenza,inj,Quad PF,6+ Mos 10/24/2013, 10/22/2014   PFIZER(Purple Top)SARS-COV-2 Vaccination 03/30/2019, 04/20/2019, 10/17/2019, 05/21/2020   Pneumococcal Conjugate-13 03/11/2018   Pneumococcal Polysaccharide-23 11/01/2015   Td 01/02/2004   Tdap 03/14/2016   Zoster, Live 09/25/2010   Health Maintenance Due  Topic Date Due   COLONOSCOPY (Pts 45-13yrs Insurance coverage will need to be confirmed)  03/07/2016      Past Medical History:  Diagnosis Date   ALLERGIC RHINITIS 02/12/2008    ASTHMA UNSPECIFIED WITH EXACERBATION 02/12/2008   HYPERLIPIDEMIA 02/12/2008   Impaired glucose tolerance 12/28/2010   Past Surgical History:  Procedure Laterality Date   TONSILLECTOMY      reports that he has been smoking cigars. He uses smokeless tobacco. He reports current alcohol use of about 8.0 standard drinks of alcohol per week. He reports that he does not use drugs. family history includes Breast cancer in his maternal grandmother; Depression in his mother; Diabetes in his maternal grandmother; Hypertension in his mother. Allergies  Allergen Reactions   Penicillins Shortness Of Breath   Current Outpatient Medications on File Prior to Visit  Medication Sig Dispense Refill   albuterol (PROAIR HFA) 108 (90 Base) MCG/ACT inhaler Inhale 2 puffs into the lungs every 6 (six) hours as needed for wheezing or shortness of breath. 8.5 g 5   aspirin 81 MG tablet Take 81 mg by mouth daily.       atorvastatin (LIPITOR) 20 MG tablet TAKE 1 TABLET(20 MG) BY MOUTH DAILY 30 tablet 0   cyclobenzaprine (FLEXERIL) 5 MG tablet Take 1 tablet (5 mg total) by mouth 3 (three) times daily as needed for muscle spasms. 40 tablet 1   fish oil-omega-3 fatty acids 1000 MG capsule Take 1,200 mg by mouth daily.     Multiple Vitamins-Minerals (CENTRUM SILVER PO) Take 1 tablet by mouth daily.     naproxen (NAPROSYN) 500 MG tablet TAKE 1 TABLET(500 MG) BY MOUTH  TWICE DAILY AS NEEDED 60 tablet 0   omeprazole (PRILOSEC) 20 MG capsule Take 1 capsule (20 mg total) by mouth 2 (two) times daily before a meal. PATIENT NEEDS APPT PRIOR TO REFILLS 60 capsule 0   traMADol (ULTRAM) 50 MG tablet Take 1 tablet (50 mg total) by mouth every 8 (eight) hours as needed. 120 tablet 2   No current facility-administered medications on file prior to visit.        ROS:  All others reviewed and negative.  Objective        PE:  BP 130/82 (BP Location: Left Arm, Patient Position: Sitting, Cuff Size: Large)   Pulse 80   Temp 97.7 F (36.5  C) (Oral)   Ht 6' (1.829 m)   Wt 224 lb (101.6 kg)   SpO2 98%   BMI 30.38 kg/m                 Constitutional: Pt appears in NAD               HENT: Head: NCAT.                Right Ear: External ear normal.                 Left Ear: External ear normal.                Eyes: . Pupils are equal, round, and reactive to light. Conjunctivae and EOM are normal               Nose: without d/c or deformity               Neck: Neck supple. Gross normal ROM               Cardiovascular: Normal rate and regular rhythm.                 Pulmonary/Chest: Effort normal and breath sounds without rales or wheezing.                Abd:  Soft, NT, ND, + BS, no organomegaly               Neurological: Pt is alert. At baseline orientation, motor grossly intact               Skin: Skin is warm. No rashes, no other new lesions, LE edema - none               Psychiatric: Pt behavior is normal without agitation   Micro: none  Cardiac tracings I have personally interpreted today:  none  Pertinent Radiological findings (summarize): none   Lab Results  Component Value Date   WBC 5.3 07/08/2020   HGB 15.0 07/08/2020   HCT 43.6 07/08/2020   PLT 165.0 07/08/2020   GLUCOSE 146 (H) 07/08/2020   CHOL 136 07/08/2020   TRIG 93.0 07/08/2020   HDL 35.20 (L) 07/08/2020   LDLDIRECT 86.0 04/21/2019   LDLCALC 82 07/08/2020   ALT 38 07/08/2020   AST 28 07/08/2020   NA 139 07/08/2020   K 4.4 07/08/2020   CL 104 07/08/2020   CREATININE 1.02 07/08/2020   BUN 18 07/08/2020   CO2 27 07/08/2020   TSH 1.30 07/08/2020   PSA 0.88 07/08/2020   HGBA1C 5.8 07/08/2020   Assessment/Plan:  Mark Wilson is a 70 y.o. White or Caucasian [1] male with  has a past medical history of ALLERGIC RHINITIS (02/12/2008), ASTHMA UNSPECIFIED WITH  EXACERBATION (02/12/2008), HYPERLIPIDEMIA (02/12/2008), and Impaired glucose tolerance (12/28/2010).  Encounter for well adult exam with abnormal findings Age and sex appropriate education and  counseling updated with regular exercise and diet Referrals for preventative services - for colnoscopy Immunizations addressed - none needed Smoking counseling  - counsled to quit cigars, pt not ready Evidence for depression or other mood disorder - none significant Most recent labs reviewed. I have personally reviewed and have noted: 1) the patient's medical and social history 2) The patient's current medications and supplements 3) The patient's height, weight, and BMI have been recorded in the chart   Allergic rhinitis Mild, for otc antihistamine and nasacort prn,  to f/u any worsening symptoms or concerns  Asthma Stable, to continue proair hfa prn,  to f/u any worsening symptoms or concerns  Hyperlipidemia Lab Results  Component Value Date   LDLCALC 82 07/08/2020   Stable, pt to continue current statin lipitor 20 for now; also for CT cardiac score as may need increased lipitor for goal < 70 if abnormal   Impaired glucose tolerance Lab Results  Component Value Date   HGBA1C 5.8 07/08/2020   Stable, pt to continue current medical treatment  - diet   History of colon polyps Also for colonoscopy as is due  Followup: Return in about 1 year (around 07/15/2021).  Cathlean Cower, MD 07/17/2020 7:23 AM [facility]

## 2020-07-16 ENCOUNTER — Other Ambulatory Visit: Payer: Self-pay | Admitting: Internal Medicine

## 2020-07-16 NOTE — Telephone Encounter (Signed)
Please refill as per office routine med refill policy (all routine meds refilled for 3 mo or monthly per pt preference up to one year from last visit, then month to month grace period for 3 mo, then further med refills will have to be denied)  

## 2020-07-17 ENCOUNTER — Encounter: Payer: Self-pay | Admitting: Internal Medicine

## 2020-07-17 ENCOUNTER — Other Ambulatory Visit: Payer: Self-pay | Admitting: Internal Medicine

## 2020-07-17 NOTE — Assessment & Plan Note (Signed)
Stable, to continue proair hfa prn,  to f/u any worsening symptoms or concerns

## 2020-07-17 NOTE — Assessment & Plan Note (Signed)
Lab Results  Component Value Date   HGBA1C 5.8 07/08/2020   Stable, pt to continue current medical treatment  - diet

## 2020-07-17 NOTE — Addendum Note (Signed)
Addended by: Biagio Borg on: 07/17/2020 07:27 AM   Modules accepted: Orders

## 2020-07-17 NOTE — Assessment & Plan Note (Signed)
Lab Results  Component Value Date   LDLCALC 82 07/08/2020   Stable, pt to continue current statin lipitor 20 for now; also for CT cardiac score as may need increased lipitor for goal < 70 if abnormal

## 2020-07-17 NOTE — Assessment & Plan Note (Signed)
Mild, for otc antihistamine and nasacort prn,  to f/u any worsening symptoms or concerns

## 2020-07-17 NOTE — Assessment & Plan Note (Signed)
Also for colonoscopy as is due 

## 2020-07-17 NOTE — Assessment & Plan Note (Signed)
Age and sex appropriate education and counseling updated with regular exercise and diet Referrals for preventative services - for colnoscopy Immunizations addressed - none needed Smoking counseling  - counsled to quit cigars, pt not ready Evidence for depression or other mood disorder - none significant Most recent labs reviewed. I have personally reviewed and have noted: 1) the patient's medical and social history 2) The patient's current medications and supplements 3) The patient's height, weight, and BMI have been recorded in the chart

## 2020-07-18 MED ORDER — NAPROXEN 500 MG PO TABS: 500.0000 mg | ORAL_TABLET | Freq: Two times a day (BID) | ORAL | 0 refills | Status: DC

## 2020-07-18 MED ORDER — OMEPRAZOLE 20 MG PO CPDR: 20.0000 mg | DELAYED_RELEASE_CAPSULE | Freq: Two times a day (BID) | ORAL | 3 refills | Status: DC

## 2020-07-28 ENCOUNTER — Ambulatory Visit (INDEPENDENT_AMBULATORY_CARE_PROVIDER_SITE_OTHER)
Admission: RE | Admit: 2020-07-28 | Discharge: 2020-07-28 | Disposition: A | Discharge status: Home / Self Care | Payer: Self-pay | Source: Ambulatory Visit | Attending: Internal Medicine | Admitting: Internal Medicine

## 2020-07-28 ENCOUNTER — Other Ambulatory Visit: Payer: Self-pay | Admitting: Internal Medicine

## 2020-07-28 ENCOUNTER — Other Ambulatory Visit: Payer: Self-pay

## 2020-07-28 ENCOUNTER — Encounter: Payer: Self-pay | Admitting: Internal Medicine

## 2020-07-28 DIAGNOSIS — R931 Abnormal findings on diagnostic imaging of heart and coronary circulation: Secondary | ICD-10-CM

## 2020-07-28 DIAGNOSIS — E7800 Pure hypercholesterolemia, unspecified: Secondary | ICD-10-CM

## 2020-08-16 ENCOUNTER — Other Ambulatory Visit: Payer: Self-pay | Admitting: Internal Medicine

## 2020-08-19 ENCOUNTER — Telehealth: Payer: Self-pay

## 2020-08-19 NOTE — Telephone Encounter (Signed)
Ok to let pt know,  the over-read is now mentioning several "spots" to the lungs that are small and not overly concerning for now, but should be looked at again in 6 mo to make sure nothing has changed  Pleaes ask pt to make ROV at 6 mo, instead of 1 year as recommended at his last visit.

## 2020-08-19 NOTE — Telephone Encounter (Signed)
The following report is an over-read performed by radiologist Dr. Minerva Fester [facility] on 08/18/2020. This over-read does not include interpretation of cardiac or coronary anatomy or pathology. The coronary calcium score interpretation by the cardiologist is attached. Limited imaging of the chest includes cardiac structures in the mid chest only.

## 2020-08-21 ENCOUNTER — Encounter: Payer: Self-pay | Admitting: Internal Medicine

## 2020-08-21 ENCOUNTER — Other Ambulatory Visit: Payer: Self-pay | Admitting: Internal Medicine

## 2020-08-21 DIAGNOSIS — R918 Other nonspecific abnormal finding of lung field: Secondary | ICD-10-CM | POA: Insufficient documentation

## 2020-08-21 DIAGNOSIS — I77810 Thoracic aortic ectasia: Secondary | ICD-10-CM | POA: Insufficient documentation

## 2020-08-21 DIAGNOSIS — I700 Atherosclerosis of aorta: Secondary | ICD-10-CM | POA: Insufficient documentation

## 2020-08-21 MED ORDER — ATORVASTATIN CALCIUM 40 MG PO TABS: 40.0000 mg | ORAL_TABLET | Freq: Every day | ORAL | 3 refills | Status: DC

## 2020-08-22 NOTE — Telephone Encounter (Signed)
LVM instructions for pt to call with result note.

## 2020-08-23 ENCOUNTER — Encounter: Payer: Self-pay | Admitting: Gastroenterology

## 2020-08-23 NOTE — Telephone Encounter (Signed)
Additional result note given to pt.  Pt states he has not heard from GI or cardiology re: referrals that were placed 7/12 & 7/28.  Pt given names of practices & states he should be hearing from them.  Pt notified that while I could call offices in an attempt to make appts for him, I would need his availability. Pt states he will call offices himself; advised to notify us if assistance needed. 25mof/u appt made. Pt verb understanding.

## 2020-08-24 ENCOUNTER — Other Ambulatory Visit: Payer: Self-pay | Admitting: Internal Medicine

## 2020-08-24 DIAGNOSIS — I77810 Thoracic aortic ectasia: Secondary | ICD-10-CM

## 2020-09-07 ENCOUNTER — Ambulatory Visit (AMBULATORY_SURGERY_CENTER): Payer: Self-pay

## 2020-09-07 ENCOUNTER — Other Ambulatory Visit: Payer: Self-pay

## 2020-09-07 VITALS — Ht 72.0 in | Wt 227.0 lb

## 2020-09-07 DIAGNOSIS — Z86010 Personal history of colonic polyps: Secondary | ICD-10-CM

## 2020-09-07 MED ORDER — PEG-KCL-NACL-NASULF-NA ASC-C 100 G PO SOLR: 1.0000 | Freq: Once | ORAL | 0 refills | Status: AC

## 2020-09-07 NOTE — Progress Notes (Signed)
Denies allergies to eggs or soy products. Denies complication of anesthesia or sedation. Denies use of weight loss medication. Denies use of O2.   Emmi instructions given for colonoscopy.  

## 2020-09-09 ENCOUNTER — Encounter: Payer: Self-pay | Admitting: Gastroenterology

## 2020-09-15 ENCOUNTER — Other Ambulatory Visit: Payer: Self-pay

## 2020-09-15 ENCOUNTER — Institutional Professional Consult (permissible substitution): Payer: No Typology Code available for payment source | Admitting: Physician Assistant

## 2020-09-15 VITALS — BP 137/87 | HR 100 | Resp 20 | Ht 72.0 in | Wt 225.0 lb

## 2020-09-15 DIAGNOSIS — I712 Thoracic aortic aneurysm, without rupture: Secondary | ICD-10-CM | POA: Diagnosis not present

## 2020-09-15 DIAGNOSIS — I7121 Aneurysm of the ascending aorta, without rupture: Secondary | ICD-10-CM

## 2020-09-15 NOTE — Progress Notes (Signed)
[name]       [name] [address]             [phone]        Mark Wilson [place] Medical Record [id number] Date of Birth: 06-Dec-1950  Referring: Biagio Borg, MD Primary Care: Biagio Borg, MD Primary Cardiologist:None  Chief Complaint:   aortic aneurysm   History of Present Illness:      Mr. Mark Wilson is a 70 year old patient with a past medical history significant for hyperlipidemia, impaired glucose tolerance, asthma, arthritis, and hx of colon polyps. He presents to the clinic today to review his CT scan from July which shows a 6 x 71m pulmonary nodule and 4.6cm ascending aortic aneurysm. He has follow-up with Cardiology to review his calcium score.   No family history of connective tissue disorders or aortic dissection. He has been asymptomatic and working 50 hours a week. 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   Current Activity/ Functional Status: Patient is independent with mobility/ambulation, transfers, ADL's, IADL's.   Zubrod Score: At the time of surgery this patient's most appropriate activity status/level should be described as: '[x]'$     0    Normal activity, no symptoms '[]'$     1    Restricted in physical strenuous activity but ambulatory, able to do out light work '[]'$     2    Ambulatory and capable of self care, unable to do work activities, up and about                 more than 50%  Of the time                            '[]'$     3    Only limited self care, in bed greater than 50% of waking hours '[]'$     4    Completely disabled, no self care, confined to bed or chair '[]'$     5    Moribund  Past Medical History:  Diagnosis Date   ALLERGIC RHINITIS 02/12/2008   Allergy    Arthritis    ASTHMA UNSPECIFIED WITH EXACERBATION 02/12/2008   HYPERLIPIDEMIA 02/12/2008   Impaired glucose tolerance 12/28/2010    Past Surgical History:  Procedure Laterality Date   TONSILLECTOMY       Social History   Tobacco Use  Smoking Status Some Days   Types: Cigars  Smokeless Tobacco Former    Social History   Substance and Sexual Activity  Alcohol Use Yes   Alcohol/week: 8.0 standard drinks   Types: 8 Cans of beer per week   Comment: occasional beer     Allergies  Allergen Reactions   Penicillins Shortness Of Breath    Current Outpatient Medications  Medication Sig Dispense Refill   albuterol (PROAIR HFA) 108 (90 Base) MCG/ACT inhaler Inhale 2 puffs into the lungs every 6 (six) hours as needed for wheezing or shortness of breath. 8.5 g 5   aspirin 81 MG tablet Take 81 mg by mouth daily.       atorvastatin (LIPITOR) 40 MG tablet Take 1 tablet (40 mg total) by mouth daily. 90 tablet 3   cyclobenzaprine (FLEXERIL) 5 MG tablet Take 1 tablet (5 mg total) by mouth 3 (three) times daily as needed for muscle spasms. 40 tablet 1   fish oil-omega-3 fatty acids  1000 MG capsule Take 1,200 mg by mouth daily.     Multiple Vitamins-Minerals (CENTRUM SILVER PO) Take 1 tablet by mouth daily.     naproxen (NAPROSYN) 500 MG tablet TAKE 1 TABLET(500 MG) BY MOUTH TWICE DAILY WITH A MEAL 60 tablet 0   omeprazole (PRILOSEC) 20 MG capsule Take 1 capsule (20 mg total) by mouth 2 (two) times daily before a meal. PATIENT NEEDS APPT PRIOR TO REFILLS 180 capsule 3   traMADol (ULTRAM) 50 MG tablet Take 1 tablet (50 mg total) by mouth every 8 (eight) hours as needed. 120 tablet 2   No current facility-administered medications for this visit.      Family History  Problem Relation Age of Onset   Hypertension Mother    Depression Mother    Breast cancer Maternal Grandmother    Diabetes Maternal Grandmother    Colon cancer Neg Hx    Esophageal cancer Neg Hx    Stomach cancer Neg Hx    Rectal cancer Neg Hx      Review of Systems:   ROS Pertinent items are noted in HPI.    Physical Exam:  Today's Vitals   09/15/20 1501  BP: 137/87  Pulse: 100  Resp: 20  SpO2: 95%  Weight:  225 lb (102.1 kg)  Height: 6' (1.829 m)   Body mass index is 30.52 kg/m.   General appearance: alert, cooperative, and no distress Resp: clear to auscultation bilaterally Cardio: regular rate and rhythm, S1, S2 normal, no murmur, click, rub or gallop GI: soft, non-tender; bowel sounds normal; no masses,  no organomegaly Extremities: extremities normal, atraumatic, no cyanosis or edema Neurologic: Grossly normal  Diagnostic Studies & Laboratory data:  ADDENDUM REPORT: 08/18/2020 17:44   EXAM: OVER-READ INTERPRETATION  CT CHEST   The following report is an over-read performed by radiologist Dr. Minerva Fester [facility] on 08/18/2020. This over-read does not include interpretation of cardiac or coronary anatomy or pathology. The coronary calcium score interpretation by the cardiologist is attached. Limited imaging of the chest includes cardiac structures in the mid chest only.   COMPARISON:  None   FINDINGS: Cardiovascular: See dedicated report regarding cardiac findings. Signs of aortic atherosclerosis with calcification about the aortic arch.   Mediastinum/Nodes: No adenopathy in the visible mediastinum. Esophagus grossly unremarkable.   Lungs/Pleura: 6 x 6 mm LEFT lower lobe pulmonary nodule. (Image 40/3) no consolidation. No pleural effusion.   Small juxtapleural nodule in the RIGHT lower lobe measures 5 mm (image 13/3)   Upper Abdomen: No acute upper abdominal findings on very limited assessment.   Musculoskeletal: No acute bone finding or destructive bone process. Spinal degenerative changes.   IMPRESSION: Pulmonary nodules at the lung bases as described, largest 6 x 6 mm. Non-contrast chest CT at 3-6 months is recommended. If the nodules are stable at time of repeat CT, then future CT at 18-24 months (from today's scan) is considered optional for low-risk patients, but is recommended for high-risk patients. This recommendation follows the  consensus statement: Guidelines for Management of Incidental Pulmonary Nodules Detected on CT Images: From the Fleischner Society 2017; Radiology 2017; 284:228-243.   Aortic Atherosclerosis (ICD10-I70.0).     Electronically Signed   By: Zetta Bills M.D.   On: 08/18/2020 17:44    Addended by Felipa Emory, MD on 08/18/2020  5:46 PM   Study Result  Narrative & Impression  CLINICAL DATA:  Risk stratification: 70 Year-old White Male   EXAM: Coronary Calcium Score  TECHNIQUE: The patient was scanned on a [employer]. Axial non-contrast 3 mm slices were carried out through the heart. The data set was analyzed on a dedicated work station and scored using the [place].   FINDINGS: Non-cardiac: See separate report from [facility].   Ascending Aorta: Evidence of moderate ascending aortic dilation (46 mm) on non-contrasted study.   Mild mitral annular calcification noted.   Pericardium: Normal.   Coronary arteries: Normal origins.   Coronary Calcium Score:   Left main: 317   Left anterior descending artery: 684   Left circumflex artery: 173   Right coronary artery: 1392   Total: 2567   Percentile: 95th for age, sex, and race matched control.   IMPRESSION: 1. Coronary calcium score of 2567. This was 95th percentile for age, gender, and race matched controls.   2. Evidence of moderate ascending aortic dilation (46 mm) on non-contrasted study. Consider secondary imaging modality (echocardiogram, CTA Aorta Protocol, MRA Aorta Protocol) if clinically indicated.   RECOMMENDATIONS:   Coronary artery calcium (CAC) score is a strong predictor of incident coronary heart disease (CHD) and provides predictive information beyond traditional risk factors. CAC scoring is reasonable to use in the decision to withhold, postpone, or initiate statin therapy in intermediate-risk or selected borderline-risk asymptomatic adults (age 60-75 years and  LDL-C >=70 to <190 mg/dL) who do not have diabetes or established atherosclerotic cardiovascular disease (ASCVD).* In intermediate-risk (10-year ASCVD risk >=7.5% to <20%) adults or selected borderline-risk (10-year ASCVD risk >=5% to <7.5%) adults in whom a CAC score is measured for the purpose of making a treatment decision the following recommendations have been made:   If CAC = 0, it is reasonable to withhold statin therapy and reassess in 5 to 10 years, as long as higher risk conditions are absent (diabetes mellitus, family history of premature CHD in first degree relatives (males <55 years; females <65 years), cigarette smoking, LDL >=190 mg/dL or other independent risk factors).   If CAC is 1 to 99, it is reasonable to initiate statin therapy for patients >=40 years of age.   If CAC is >=100 or >=75th percentile, it is reasonable to initiate statin therapy at any age.   Cardiology referral should be considered for patients with CAC scores =400 or >=75th percentile.   *2018 AHA/ACC/AACVPR/AAPA/ABC/ACPM/ADA/AGS/APhA/ASPC/NLA/PCNA Guideline on the Management of Blood Cholesterol: A Report of the American College of Cardiology/American Heart Association Task Force on Clinical Practice Guidelines. J Am Coll Cardiol. 2019;73(24):3168-3209.   Rudean Haskell, MD   Electronically Signed: By: Rudean Haskell MD On: 07/28/2020 17:06       Recent Radiology Findings:   No results found.   I have independently reviewed the above radiologic studies and discussed with the patient   Recent Lab Findings: Lab Results  Component Value Date   WBC 5.3 07/08/2020   HGB 15.0 07/08/2020   HCT 43.6 07/08/2020   PLT 165.0 07/08/2020   GLUCOSE 146 (H) 07/08/2020   CHOL 136 07/08/2020   TRIG 93.0 07/08/2020   HDL 35.20 (L) 07/08/2020   LDLDIRECT 86.0 04/21/2019   LDLCALC 82 07/08/2020   ALT 38 07/08/2020   AST 28 07/08/2020   NA 139 07/08/2020   K 4.4 07/08/2020   CL  104 07/08/2020   CREATININE 1.02 07/08/2020   BUN 18 07/08/2020   CO2 27 07/08/2020   TSH 1.30 07/08/2020   HGBA1C 5.8 07/08/2020      Assessment / Plan:  6 x 62m lung nodule- recommending 3-6 month surveillance Ascending aortic aneurysm measuring 4.6cm. We discussed tight blood pressure control and no heavy lifting. He is going to use a lift truck at work and ask for help if items are too heavy to lift on his own.  Coronary calcium score of 2567- Cardiology follow-up planned for November 2022. Increased Lipitor to '40mg'$ .  Hyperlipidemia-continue lipitor Asthma-he is on Albuterol PRN Impaired glucose tolerance- A1C is 5.8, PCP watching closely Arthritis-Naproxen daily '1000mg'$  for pain and he then takes Prilosec for his GI symptoms   Plan: Reviewed the CT scan with the patient and answered all questions to the patient's satisfaction. He is agreeable to return in 6 months for a CT scan to further evaluate his lung nodules. If there is no growth, should be okay to follow annually with a CT scan for surveillance of both the nodules and his ascending aortic aneurysm. Encouraged tight blood pressure control and to continue to follow-up with his PCP, Dr. JJenny Reichmann No fluoroquinolones.   I  spent 30 minutes counseling the patient face to face.   TNicholes Rough PA-C 09/15/2020 8:46 AM

## 2020-09-15 NOTE — Patient Instructions (Addendum)
No fluoroquinolones.

## 2020-09-20 ENCOUNTER — Other Ambulatory Visit: Payer: Self-pay

## 2020-09-20 ENCOUNTER — Ambulatory Visit (AMBULATORY_SURGERY_CENTER): Payer: No Typology Code available for payment source | Admitting: Gastroenterology

## 2020-09-20 ENCOUNTER — Encounter: Payer: Self-pay | Admitting: Gastroenterology

## 2020-09-20 VITALS — BP 126/87 | HR 74 | Temp 97.5°F | Resp 19 | Ht 72.0 in | Wt 227.0 lb

## 2020-09-20 DIAGNOSIS — Z86010 Personal history of colonic polyps: Secondary | ICD-10-CM

## 2020-09-20 DIAGNOSIS — D123 Benign neoplasm of transverse colon: Secondary | ICD-10-CM | POA: Diagnosis not present

## 2020-09-20 MED ORDER — SODIUM CHLORIDE 0.9 % IV SOLN: 500.0000 mL | Freq: Once | INTRAVENOUS | Status: DC

## 2020-09-20 NOTE — Op Note (Signed)
[place] Patient Name: Mark Wilson Procedure Date: 09/20/2020 10:00 AM MRN: [id number] Endoscopist: Ladene Artist , MD Age: 70 Referring MD:  Date of Birth: [birth year] Gender: Male Account #: [id number] Procedure:                Colonoscopy Indications:              Surveillance: Personal history of adenomatous                            polyps on last colonoscopy > 5 years ago Medicines:                Monitored Anesthesia Care Procedure:                Pre-Anesthesia Assessment:                           - Prior to the procedure, a History and Physical                            was performed, and patient medications and                            allergies were reviewed. The patient's tolerance of                            previous anesthesia was also reviewed. The risks                            and benefits of the procedure and the sedation                            options and risks were discussed with the patient.                            All questions were answered, and informed consent                            was obtained. Prior Anticoagulants: The patient has                            taken no previous anticoagulant or antiplatelet                            agents. ASA Grade Assessment: II - A patient with                            mild systemic disease. After reviewing the risks                            and benefits, the patient was deemed in                            satisfactory condition to undergo the procedure.  After obtaining informed consent, the colonoscope                            was passed under direct vision. Throughout the                            procedure, the patient's blood pressure, pulse, and                            oxygen saturations were monitored continuously. The                            CF [id number] #[id number] was introduced through the anus                            and advanced to the the  cecum, identified by                            appendiceal orifice and ileocecal valve. The                            ileocecal valve, appendiceal orifice, and rectum                            were photographed. The quality of the bowel                            preparation was good. The colonoscopy was performed                            without difficulty. The patient tolerated the                            procedure well. Scope In: 10:08:24 AM Scope Out: 10:27:18 AM Scope Withdrawal Time: 0 hours 16 minutes 45 seconds  Total Procedure Duration: 0 hours 18 minutes 54 seconds  Findings:                 The perianal and digital rectal examinations were                            normal.                           Four sessile polyps were found in the transverse                            colon. The polyps were 7 to 8 mm in size. These                            polyps were removed with a cold snare. Resection                            and retrieval were complete.  Multiple medium-mouthed diverticula were found in                            the left colon. There was evidence of diverticular                            spasm. There was evidence of an impacted                            diverticulum.                           Internal hemorrhoids were found during                            retroflexion. The hemorrhoids were moderate and                            Grade I (internal hemorrhoids that do not prolapse).                           The exam was otherwise without abnormality on                            direct and retroflexion views. Complications:            No immediate complications. Estimated blood loss:                            None. Estimated Blood Loss:     Estimated blood loss: none. Impression:               - Four 7 to 8 mm polyps in the transverse colon,                            removed with a cold snare. Resected and retrieved.                            - Moderate diverticulosis in the left colon.                           - Internal hemorrhoids.                           - The examination was otherwise normal on direct                            and retroflexion views. Recommendation:           - Repeat colonoscopy after studies are complete for                            surveillance based on pathology results.                           - Patient has a contact number available for  emergencies. The signs and symptoms of potential                            delayed complications were discussed with the                            patient. Return to normal activities tomorrow.                            Written discharge instructions were provided to the                            patient.                           - High fiber diet.                           - Continue present medications.                           - Await pathology results. Ladene Artist, MD 09/20/2020 10:31:50 AM This report has been signed electronically.

## 2020-09-20 NOTE — Progress Notes (Signed)
See 09/15/2020 H&P, no changes. No active GI complaints. This patient is appropriate for endoscopic procedures in the ambulatory setting.

## 2020-09-20 NOTE — Progress Notes (Signed)
Report to PACU, RN, vss, BBS= Clear.  

## 2020-09-20 NOTE — Progress Notes (Signed)
Pt's states no medical or surgical changes since previsit or office visit. Vitals done by DT.

## 2020-09-20 NOTE — Patient Instructions (Signed)
Handouts were given to your care partner on polyps, diverticulosis, hemorrhoids and a high fiber diet with liberal fluid intake. You may resume your current medications today. Await biopsy results.  May take 1-3 weeks to receive pathology results. Please call if any questions or concerns.     YOU HAD AN ENDOSCOPIC PROCEDURE TODAY AT [facility]:   Refer to the procedure report that was given to you for any specific questions about what was found during the examination.  If the procedure report does not answer your questions, please call your gastroenterologist to clarify.  If you requested that your care partner not be given the details of your procedure findings, then the procedure report has been included in a sealed envelope for you to review at your convenience later.  YOU SHOULD EXPECT: Some feelings of bloating in the abdomen. Passage of more gas than usual.  Walking can help get rid of the air that was put into your GI tract during the procedure and reduce the bloating. If you had a lower endoscopy (such as a colonoscopy or flexible sigmoidoscopy) you may notice spotting of blood in your stool or on the toilet paper. If you underwent a bowel prep for your procedure, you may not have a normal bowel movement for a few days.  Please Note:  You might notice some irritation and congestion in your nose or some drainage.  This is from the oxygen used during your procedure.  There is no need for concern and it should clear up in a day or so.  SYMPTOMS TO REPORT IMMEDIATELY:  Following lower endoscopy (colonoscopy or flexible sigmoidoscopy):  Excessive amounts of blood in the stool  Significant tenderness or worsening of abdominal pains  Swelling of the abdomen that is new, acute  Fever of 100F or higher    For urgent or emergent issues, a gastroenterologist can be reached at any hour by calling [phone]. Do not use MyChart messaging for urgent concerns.    DIET:  We  do recommend a small meal at first, but then you may proceed to your regular diet.  Drink plenty of fluids but you should avoid alcoholic beverages for 24 hours.  ACTIVITY:  You should plan to take it easy for the rest of today and you should NOT DRIVE or use heavy machinery until tomorrow (because of the sedation medicines used during the test).    FOLLOW UP: Our staff will call the number listed on your records 48-72 hours following your procedure to check on you and address any questions or concerns that you may have regarding the information given to you following your procedure. If we do not reach you, we will leave a message.  We will attempt to reach you two times.  During this call, we will ask if you have developed any symptoms of COVID 19. If you develop any symptoms (ie: fever, flu-like symptoms, shortness of breath, cough etc.) before then, please call [phone].  If you test positive for Covid 19 in the 2 weeks post procedure, please call and report this information to [place].    If any biopsies were taken you will be contacted by phone or by letter within the next 1-3 weeks.  Please call us at [phone] if you have not heard about the biopsies in 3 weeks.    SIGNATURES/CONFIDENTIALITY: You and/or your care partner have signed paperwork which will be entered into your electronic medical record.  These signatures attest to the fact  that that the information above on your After Visit Summary has been reviewed and is understood.  Full responsibility of the confidentiality of this discharge information lies with you and/or your care-partner.

## 2020-09-20 NOTE — Progress Notes (Signed)
No problems noted in the recovery room. maw 

## 2020-09-20 NOTE — Progress Notes (Signed)
Called to room to assist during endoscopic procedure.  Patient ID and intended procedure confirmed with present staff. Received instructions for my participation in the procedure from the performing physician.  

## 2020-09-22 ENCOUNTER — Telehealth: Payer: Self-pay

## 2020-09-22 NOTE — Telephone Encounter (Signed)
Left message on follow up call. 

## 2020-09-22 NOTE — Telephone Encounter (Signed)
  Follow up Call-  Call back number 09/20/2020  Post procedure Call Back phone  # [phone]  Permission to leave phone message Yes  Some recent data might be hidden     Patient questions:  Do you have a fever, pain , or abdominal swelling? No. Pain Score  0 *  Have you tolerated food without any problems? Yes.    Have you been able to return to your normal activities? Yes.    Do you have any questions about your discharge instructions: Diet   No. Medications  No. Follow up visit  No.  Do you have questions or concerns about your Care? No.  Actions: * If pain score is 4 or above: No action needed, pain <4.

## 2020-09-30 ENCOUNTER — Other Ambulatory Visit: Payer: Self-pay

## 2020-09-30 ENCOUNTER — Ambulatory Visit (INDEPENDENT_AMBULATORY_CARE_PROVIDER_SITE_OTHER): Payer: No Typology Code available for payment source

## 2020-09-30 DIAGNOSIS — Z23 Encounter for immunization: Secondary | ICD-10-CM | POA: Diagnosis not present

## 2020-09-30 NOTE — Progress Notes (Addendum)
Pt given High Dose flu vacc w/o any complications.  Medical screening examination/treatment/procedure(s) were performed by non-physician practitioner and as supervising physician I was immediately available for consultation/collaboration.  I agree with above. Cathlean Cower, MD

## 2020-10-04 ENCOUNTER — Encounter: Payer: Self-pay | Admitting: Gastroenterology

## 2020-10-16 ENCOUNTER — Other Ambulatory Visit: Payer: Self-pay | Admitting: Internal Medicine

## 2020-11-02 ENCOUNTER — Encounter: Payer: Self-pay | Admitting: Interventional Cardiology

## 2020-11-02 ENCOUNTER — Encounter: Payer: Self-pay | Admitting: *Deleted

## 2020-11-02 ENCOUNTER — Telehealth (HOSPITAL_COMMUNITY): Payer: Self-pay | Admitting: *Deleted

## 2020-11-02 ENCOUNTER — Ambulatory Visit: Payer: No Typology Code available for payment source | Admitting: Interventional Cardiology

## 2020-11-02 ENCOUNTER — Encounter (HOSPITAL_COMMUNITY): Payer: Self-pay | Admitting: *Deleted

## 2020-11-02 ENCOUNTER — Other Ambulatory Visit: Payer: Self-pay

## 2020-11-02 VITALS — BP 138/82 | HR 85 | Ht 72.0 in | Wt 227.0 lb

## 2020-11-02 DIAGNOSIS — E782 Mixed hyperlipidemia: Secondary | ICD-10-CM | POA: Diagnosis not present

## 2020-11-02 DIAGNOSIS — I700 Atherosclerosis of aorta: Secondary | ICD-10-CM | POA: Diagnosis not present

## 2020-11-02 DIAGNOSIS — I2584 Coronary atherosclerosis due to calcified coronary lesion: Secondary | ICD-10-CM

## 2020-11-02 DIAGNOSIS — R0609 Other forms of dyspnea: Secondary | ICD-10-CM

## 2020-11-02 DIAGNOSIS — I2510 Atherosclerotic heart disease of native coronary artery without angina pectoris: Secondary | ICD-10-CM | POA: Diagnosis not present

## 2020-11-02 DIAGNOSIS — I77810 Thoracic aortic ectasia: Secondary | ICD-10-CM | POA: Diagnosis not present

## 2020-11-02 MED ORDER — CARVEDILOL 3.125 MG PO TABS: 3.1250 mg | ORAL_TABLET | Freq: Two times a day (BID) | ORAL | 3 refills | Status: DC

## 2020-11-02 NOTE — Progress Notes (Signed)
Cardiology Office Note   Date:  11/02/2020   ID:  Mark Wilson, DOB July 03, 1950, MRN [id number]  PCP:  Biagio Borg, MD    No chief complaint on file.  Coronary artery calcification  Wt Readings from Last 3 Encounters:  11/02/20 227 lb (103 kg)  09/20/20 227 lb (103 kg)  09/15/20 225 lb (102.1 kg)       History of Present Illness: Mark Wilson is a 70 y.o. male who is being seen today for the evaluation of coronary artery calcification at the request of Biagio Borg, MD.   CT scan in July 2022 showed: "Coronary calcium score of 2567. This was 95th percentile for age, gender, and race matched controls.   2. Evidence of moderate ascending aortic dilation (46 mm) on non-contrasted study. Consider secondary imaging modality (echocardiogram, CTA Aorta Protocol, MRA Aorta Protocol) if clinically indicated."  He works in maintenance.  He does a lot of heavy lifting, moving appliances up stairs.   Denies : Dizziness. Leg edema. Nitroglycerin use. Orthopnea. Palpitations. Paroxysmal nocturnal dyspnea. Syncope.    Past Medical History:  Diagnosis Date   ALLERGIC RHINITIS 02/12/2008   Allergy    Arthritis    ASTHMA UNSPECIFIED WITH EXACERBATION 02/12/2008   HYPERLIPIDEMIA 02/12/2008   Impaired glucose tolerance 12/28/2010    Past Surgical History:  Procedure Laterality Date   TONSILLECTOMY       Current Outpatient Medications  Medication Sig Dispense Refill   albuterol (PROAIR HFA) 108 (90 Base) MCG/ACT inhaler Inhale 2 puffs into the lungs every 6 (six) hours as needed for wheezing or shortness of breath. 8.5 g 5   aspirin 81 MG tablet Take 81 mg by mouth daily.       atorvastatin (LIPITOR) 40 MG tablet Take 1 tablet (40 mg total) by mouth daily. 90 tablet 3   cyclobenzaprine (FLEXERIL) 5 MG tablet Take 1 tablet (5 mg total) by mouth 3 (three) times daily as needed for muscle spasms. 40 tablet 1   fish oil-omega-3 fatty acids 1000 MG capsule Take 1,200 mg by mouth daily.      Multiple Vitamins-Minerals (CENTRUM SILVER PO) Take 1 tablet by mouth daily.     naproxen (NAPROSYN) 500 MG tablet TAKE 1 TABLET(500 MG) BY MOUTH TWICE DAILY WITH A MEAL 60 tablet 0   omeprazole (PRILOSEC) 20 MG capsule Take 1 capsule (20 mg total) by mouth 2 (two) times daily before a meal. PATIENT NEEDS APPT PRIOR TO REFILLS 180 capsule 3   traMADol (ULTRAM) 50 MG tablet Take 1 tablet (50 mg total) by mouth every 8 (eight) hours as needed. 120 tablet 2   No current facility-administered medications for this visit.    Allergies:   Penicillins    Social History:  The patient  reports that he has been smoking cigars. He has quit using smokeless tobacco. He reports current alcohol use of about 8.0 standard drinks per week. He reports that he does not use drugs.   Family History:  The patient's family history includes Breast cancer in his maternal grandmother; Depression in his mother; Diabetes in his maternal grandmother; Hypertension in his mother.    ROS:  Please see the history of present illness.   Otherwise, review of systems are positive for DOE.   All other systems are reviewed and negative.    PHYSICAL EXAM: VS:  BP 138/82   Pulse 85   Ht 6' (1.829 m)   Wt 227 lb (103 kg)   SpO2  98%   BMI 30.79 kg/m  , BMI Body mass index is 30.79 kg/m. GEN: Well nourished, well developed, in no acute distress HEENT: normal Neck: no JVD, carotid bruits, or masses Cardiac: RRR; no murmurs, rubs, or gallops,no edema  Respiratory:  clear to auscultation bilaterally, normal work of breathing GI: soft, nontender, nondistended, + BS MS: no deformity or atrophy Skin: warm and dry, no rash Neuro:  Strength and sensation are intact Psych: euthymic mood, full affect   EKG:   The ekg ordered today demonstrates NSR, nonspecific ST changes   Recent Labs: 07/08/2020: ALT 38; BUN 18; Creatinine, Ser 1.02; Hemoglobin 15.0; Platelets 165.0; Potassium 4.4; Sodium 139; TSH 1.30   Lipid Panel     Component Value Date/Time   CHOL 136 07/08/2020 0832   TRIG 93.0 07/08/2020 0832   HDL 35.20 (L) 07/08/2020 0832   CHOLHDL 4 07/08/2020 0832   VLDL 18.6 07/08/2020 0832   LDLCALC 82 07/08/2020 0832   LDLDIRECT 86.0 04/21/2019 1626     Other studies Reviewed: Additional studies/ records that were reviewed today with results demonstrating: CT results reviewed.   ASSESSMENT AND PLAN:  Coronary artery calcification: Continue aggressive preventive therapy including healthy diet and high potency statin.  Given DOE, would plan for exercise myoview in the setting of high coronary calcium score to evaluate for obstructive coronary disease.  Ascending aortic aneurysm: 46 mm at prior check.  Will need annual imaging.  Start Coreg 3.125 mg BID- tight BP control and slower HR beneficial.  CT scan in 6 months with the surgeon.  HE requests a letter to limit lifting more than 40 lbs to avoid straining.  Letter provided. Hyperlipidemia: Continue lipid lowering therapy.  Increasing fiber intake.   Minimize NSAID use to reduce risk of bleeding and risk of chronic kidney disease.   Current medicines are reviewed at length with the patient today.  The patient concerns regarding his medicines were addressed.  The following changes have been made:  as above  Labs/ tests ordered today include:  No orders of the defined types were placed in this encounter.   Recommend 150 minutes/week of aerobic exercise Low fat, low carb, high fiber diet recommended  Disposition:   FU in 1 year, sooner if stress test abnormal   Signed, Larae Grooms, MD  11/02/2020 9:18 AM    [facility] [address], [place], [place]  [address] Phone: [phone]; Fax: [phone]

## 2020-11-02 NOTE — Addendum Note (Signed)
Addended by: Juventino Slovak on: 11/02/2020 01:08 PM   Modules accepted: Orders

## 2020-11-02 NOTE — Patient Instructions (Signed)
Medication Instructions:  1.Start carvedilol (Coreg) 3.125 mg, one tablet by mouth twice a day *If you need a refill on your cardiac medications before your next appointment, please call your pharmacy*   Lab Work: None If you have labs (blood work) drawn today and your tests are completely normal, you will receive your results only by: [place] (if you have MyChart) OR A paper copy in the mail If you have any lab test that is abnormal or we need to change your treatment, we will call you to review the results.   Testing/Procedures: Your physician has requested that you have en exercise stress myoview. For further information please visit [email or web address]. Please follow instruction sheet, as given.    Follow-Up: At [facility], you and your health needs are our priority.  As part of our continuing mission to provide you with exceptional heart care, we have created designated Provider Care Teams.  These Care Teams include your primary Cardiologist (physician) and Advanced Practice Providers (APPs -  Physician Assistants and Nurse Practitioners) who all work together to provide you with the care you need, when you need it.   Your next appointment:   1 year(s)  The format for your next appointment:   In Person  Provider:   Casandra Doffing, MD

## 2020-11-02 NOTE — Telephone Encounter (Signed)
Left message on voicemail in reference to upcoming appointment scheduled for 11/09/20. Phone number given for a call back so details instructions can be given. [place] Mychart letter sent

## 2020-11-03 NOTE — Addendum Note (Signed)
Addended by: Jettie Booze on: 11/03/2020 01:28 PM   Modules accepted: Orders

## 2020-11-07 ENCOUNTER — Telehealth (HOSPITAL_COMMUNITY): Payer: Self-pay | Admitting: *Deleted

## 2020-11-07 NOTE — Telephone Encounter (Signed)
Left message on voicemail in reference to upcoming appointment scheduled for 11/09/20 Phone number given for a call back so details instructions can be given.  Mark Wilson

## 2020-11-09 ENCOUNTER — Other Ambulatory Visit: Payer: Self-pay

## 2020-11-09 ENCOUNTER — Ambulatory Visit (HOSPITAL_COMMUNITY): Payer: No Typology Code available for payment source | Attending: Interventional Cardiology

## 2020-11-09 DIAGNOSIS — I700 Atherosclerosis of aorta: Secondary | ICD-10-CM

## 2020-11-09 DIAGNOSIS — I77810 Thoracic aortic ectasia: Secondary | ICD-10-CM | POA: Diagnosis present

## 2020-11-09 LAB — MYOCARDIAL PERFUSION IMAGING
Angina Index: 0
Duke Treadmill Score: 4
Estimated workload: 6.1
Exercise duration (min): 4 min
Exercise duration (sec): 16 s
LV dias vol: 63 mL (ref 62–150)
LV sys vol: 22 mL
MPHR: 150 {beats}/min
Nuc Stress EF: 64 %
Peak HR: 144 {beats}/min
Percent HR: 96 %
Rest HR: 72 {beats}/min
Rest Nuclear Isotope Dose: 7.6 mCi
SDS: 3
SRS: 0
SSS: 3
ST Depression (mm): 0 mm
Stress Nuclear Isotope Dose: 28.4 mCi
TID: 1.01

## 2020-11-09 MED ORDER — TECHNETIUM TC 99M TETROFOSMIN IV KIT
7.6000 | PACK | Freq: Once | INTRAVENOUS | Status: AC | PRN
  Administered 2020-11-09: 7.6 via INTRAVENOUS
  Filled 2020-11-09: qty 8

## 2020-11-09 MED ORDER — TECHNETIUM TC 99M TETROFOSMIN IV KIT
28.4000 | PACK | Freq: Once | INTRAVENOUS | Status: AC | PRN
  Administered 2020-11-09: 28.4 via INTRAVENOUS
  Filled 2020-11-09: qty 29

## 2021-01-16 ENCOUNTER — Encounter: Payer: Self-pay | Admitting: Internal Medicine

## 2021-01-16 ENCOUNTER — Ambulatory Visit: Payer: No Typology Code available for payment source | Admitting: Internal Medicine

## 2021-01-16 ENCOUNTER — Other Ambulatory Visit: Payer: Self-pay

## 2021-01-16 ENCOUNTER — Other Ambulatory Visit: Payer: Self-pay | Admitting: Internal Medicine

## 2021-01-16 VITALS — BP 132/80 | HR 76 | Temp 97.4°F | Ht 72.0 in | Wt 224.0 lb

## 2021-01-16 DIAGNOSIS — Z720 Tobacco use: Secondary | ICD-10-CM

## 2021-01-16 DIAGNOSIS — R739 Hyperglycemia, unspecified: Secondary | ICD-10-CM

## 2021-01-16 DIAGNOSIS — E7800 Pure hypercholesterolemia, unspecified: Secondary | ICD-10-CM

## 2021-01-16 DIAGNOSIS — E538 Deficiency of other specified B group vitamins: Secondary | ICD-10-CM

## 2021-01-16 DIAGNOSIS — E559 Vitamin D deficiency, unspecified: Secondary | ICD-10-CM

## 2021-01-16 DIAGNOSIS — I700 Atherosclerosis of aorta: Secondary | ICD-10-CM

## 2021-01-16 DIAGNOSIS — Z0001 Encounter for general adult medical examination with abnormal findings: Secondary | ICD-10-CM | POA: Diagnosis not present

## 2021-01-16 DIAGNOSIS — R918 Other nonspecific abnormal finding of lung field: Secondary | ICD-10-CM

## 2021-01-16 DIAGNOSIS — I77810 Thoracic aortic ectasia: Secondary | ICD-10-CM

## 2021-01-16 DIAGNOSIS — M545 Low back pain, unspecified: Secondary | ICD-10-CM | POA: Diagnosis not present

## 2021-01-16 DIAGNOSIS — R7302 Impaired glucose tolerance (oral): Secondary | ICD-10-CM | POA: Diagnosis not present

## 2021-01-16 LAB — HEPATIC FUNCTION PANEL
ALT: 44 U/L (ref 0–53)
AST: 31 U/L (ref 0–37)
Albumin: 4.7 g/dL (ref 3.5–5.2)
Alkaline Phosphatase: 74 U/L (ref 39–117)
Bilirubin, Direct: 0.2 mg/dL (ref 0.0–0.3)
Total Bilirubin: 1.1 mg/dL (ref 0.2–1.2)
Total Protein: 7.3 g/dL (ref 6.0–8.3)

## 2021-01-16 LAB — URINALYSIS, ROUTINE W REFLEX MICROSCOPIC
Bilirubin Urine: NEGATIVE
Hgb urine dipstick: NEGATIVE
Ketones, ur: NEGATIVE
Leukocytes,Ua: NEGATIVE
Nitrite: NEGATIVE
RBC / HPF: NONE SEEN (ref 0–?)
Specific Gravity, Urine: 1.015 (ref 1.000–1.030)
Total Protein, Urine: NEGATIVE
Urine Glucose: NEGATIVE
Urobilinogen, UA: 0.2 (ref 0.0–1.0)
pH: 6 (ref 5.0–8.0)

## 2021-01-16 LAB — PSA: PSA: 0.77 ng/mL (ref 0.10–4.00)

## 2021-01-16 LAB — CBC WITH DIFFERENTIAL/PLATELET
Basophils Absolute: 0 10*3/uL (ref 0.0–0.1)
Basophils Relative: 0.7 % (ref 0.0–3.0)
Eosinophils Absolute: 0.2 10*3/uL (ref 0.0–0.7)
Eosinophils Relative: 4.1 % (ref 0.0–5.0)
HCT: 46.8 % (ref 39.0–52.0)
Hemoglobin: 15.8 g/dL (ref 13.0–17.0)
Lymphocytes Relative: 27.6 % (ref 12.0–46.0)
Lymphs Abs: 1.7 10*3/uL (ref 0.7–4.0)
MCHC: 33.7 g/dL (ref 30.0–36.0)
MCV: 92.3 fl (ref 78.0–100.0)
Monocytes Absolute: 0.6 10*3/uL (ref 0.1–1.0)
Monocytes Relative: 9.7 % (ref 3.0–12.0)
Neutro Abs: 3.5 10*3/uL (ref 1.4–7.7)
Neutrophils Relative %: 57.9 % (ref 43.0–77.0)
Platelets: 169 10*3/uL (ref 150.0–400.0)
RBC: 5.07 Mil/uL (ref 4.22–5.81)
RDW: 13.6 % (ref 11.5–15.5)
WBC: 6 10*3/uL (ref 4.0–10.5)

## 2021-01-16 LAB — BASIC METABOLIC PANEL
BUN: 18 mg/dL (ref 6–23)
CO2: 26 mEq/L (ref 19–32)
Calcium: 9.4 mg/dL (ref 8.4–10.5)
Chloride: 103 mEq/L (ref 96–112)
Creatinine, Ser: 1 mg/dL (ref 0.40–1.50)
GFR: 76.08 mL/min (ref >60.00)
Glucose, Bld: 118 mg/dL — ABNORMAL HIGH (ref 70–99)
Potassium: 4.4 mEq/L (ref 3.5–5.1)
Sodium: 138 mEq/L (ref 135–145)

## 2021-01-16 LAB — HEMOGLOBIN A1C: Hgb A1c MFr Bld: 6 % (ref 4.6–6.5)

## 2021-01-16 LAB — LIPID PANEL
Cholesterol: 135 mg/dL (ref 0–200)
HDL: 37.6 mg/dL — ABNORMAL LOW (ref >39.00)
LDL Cholesterol: 67 mg/dL (ref 0–99)
NonHDL: 97.34
Total CHOL/HDL Ratio: 4
Triglycerides: 153 mg/dL — ABNORMAL HIGH (ref 0.0–149.0)
VLDL: 30.6 mg/dL (ref 0.0–40.0)

## 2021-01-16 LAB — TSH: TSH: 1.3 u[IU]/mL (ref 0.35–5.50)

## 2021-01-16 LAB — VITAMIN B12: Vitamin B-12: 282 pg/mL (ref 211–911)

## 2021-01-16 LAB — VITAMIN D 25 HYDROXY (VIT D DEFICIENCY, FRACTURES): VITD: 38.66 ng/mL (ref 30.00–100.00)

## 2021-01-16 MED ORDER — TRAMADOL HCL 50 MG PO TABS: 50.0000 mg | ORAL_TABLET | Freq: Three times a day (TID) | ORAL | 1 refills | Status: AC | PRN

## 2021-01-16 NOTE — Progress Notes (Signed)
Patient ID: Mark Wilson, male   DOB: [birth year], 71 y.o.   MRN: [id number]         Chief Complaint:: wellness exam and Follow-up  Htn, hld, chronic pain, smoker       HPI:  Mark Wilson is a 71 y.o. male here for wellness exam; declines covid booster, o/w up to date                        Also Pt denies chest pain, increased sob or doe, wheezing, orthopnea, PND, increased LE swelling, palpitations, dizziness or syncope.   Pt denies polydipsia, polyuria, or new focal neuro s/s.  Trying to follow low chol diet.  Has ongoing chronic pain, asks for tramadol refill, uses sparingly, usually lasts 1 yr.  Still smoking, not ready to quit.   Pt denies fever, wt loss, night sweats, loss of appetite, or other constitutional symptoms     Wt Readings from Last 3 Encounters:  01/16/21 224 lb (101.6 kg)  11/09/20 227 lb (103 kg)  11/02/20 227 lb (103 kg)   BP Readings from Last 3 Encounters:  01/16/21 132/80  11/02/20 138/82  09/20/20 126/87   Immunization History  Administered Date(s) Administered   Fluad Quad(high Dose 65+) 09/20/2018, 10/09/2019, 09/30/2020   H1N1 02/12/2008   Influenza Split 09/25/2010, 11/05/2011   Influenza Whole 10/02/2007, 11/21/2009   Influenza, High Dose Seasonal PF 09/25/2016, 09/17/2017   Influenza,inj,Quad PF,6+ Mos 10/24/2013, 10/22/2014   PFIZER Comirnaty(Gray Top)Covid-19 Tri-Sucrose Vaccine 05/21/2020   PFIZER(Purple Top)SARS-COV-2 Vaccination 04/20/2019, 10/17/2019, 10/01/2020   Pneumococcal Conjugate-13 03/11/2018   Pneumococcal Polysaccharide-23 11/01/2015   Td 01/02/2004   Tdap 03/14/2016   Zoster Recombinat (Shingrix) 10/16/2020, 12/22/2020   Zoster, Live 09/25/2010   There are no preventive care reminders to display for this patient.     Past Medical History:  Diagnosis Date   ALLERGIC RHINITIS 02/12/2008   Allergy    Arthritis    ASTHMA UNSPECIFIED WITH EXACERBATION 02/12/2008   HYPERLIPIDEMIA 02/12/2008   Impaired glucose tolerance 12/28/2010    Past Surgical History:  Procedure Laterality Date   TONSILLECTOMY      reports that he has been smoking cigars. He has quit using smokeless tobacco. He reports current alcohol use of about 8.0 standard drinks per week. He reports that he does not use drugs. family history includes Breast cancer in his maternal grandmother; Depression in his mother; Diabetes in his maternal grandmother; Hypertension in his mother. Allergies  Allergen Reactions   Penicillins Shortness Of Breath   Current Outpatient Medications on File Prior to Visit  Medication Sig Dispense Refill   albuterol (PROAIR HFA) 108 (90 Base) MCG/ACT inhaler Inhale 2 puffs into the lungs every 6 (six) hours as needed for wheezing or shortness of breath. 8.5 g 5   aspirin 81 MG tablet Take 81 mg by mouth daily.       atorvastatin (LIPITOR) 40 MG tablet Take 1 tablet (40 mg total) by mouth daily. 90 tablet 3   carvedilol (COREG) 3.125 MG tablet Take 1 tablet (3.125 mg total) by mouth 2 (two) times daily. 180 tablet 3   cyclobenzaprine (FLEXERIL) 5 MG tablet Take 1 tablet (5 mg total) by mouth 3 (three) times daily as needed for muscle spasms. 40 tablet 1   fish oil-omega-3 fatty acids 1000 MG capsule Take 1,200 mg by mouth daily.     Multiple Vitamins-Minerals (CENTRUM SILVER PO) Take 1 tablet by mouth daily.     naproxen (  NAPROSYN) 500 MG tablet TAKE 1 TABLET(500 MG) BY MOUTH TWICE DAILY WITH A MEAL 60 tablet 0   omeprazole (PRILOSEC) 20 MG capsule Take 1 capsule (20 mg total) by mouth 2 (two) times daily before a meal. PATIENT NEEDS APPT PRIOR TO REFILLS 180 capsule 3   No current facility-administered medications on file prior to visit.        ROS:  All others reviewed and negative.  Objective        PE:  BP 132/80 (BP Location: Left Arm, Patient Position: Sitting, Cuff Size: Large)    Pulse 76    Temp (!) 97.4 F (36.3 C) (Oral)    Ht 6' (1.829 m)    Wt 224 lb (101.6 kg)    SpO2 95%    BMI 30.38 kg/m                  Constitutional: Pt appears in NAD               HENT: Head: NCAT.                Right Ear: External ear normal.                 Left Ear: External ear normal.                Eyes: . Pupils are equal, round, and reactive to light. Conjunctivae and EOM are normal               Nose: without d/c or deformity               Neck: Neck supple. Gross normal ROM               Cardiovascular: Normal rate and regular rhythm.                 Pulmonary/Chest: Effort normal and breath sounds without rales or wheezing.                Abd:  Soft, NT, ND, + BS, no organomegaly               Neurological: Pt is alert. At baseline orientation, motor grossly intact               Skin: Skin is warm. No rashes, no other new lesions, LE edema - none               Psychiatric: Pt behavior is normal without agitation   Micro: none  Cardiac tracings I have personally interpreted today:  none  Pertinent Radiological findings (summarize): none   Lab Results  Component Value Date   WBC 6.0 01/16/2021   HGB 15.8 01/16/2021   HCT 46.8 01/16/2021   PLT 169.0 01/16/2021   GLUCOSE 118 (H) 01/16/2021   CHOL 135 01/16/2021   TRIG 153.0 (H) 01/16/2021   HDL 37.60 (L) 01/16/2021   LDLDIRECT 86.0 04/21/2019   LDLCALC 67 01/16/2021   ALT 44 01/16/2021   AST 31 01/16/2021   NA 138 01/16/2021   K 4.4 01/16/2021   CL 103 01/16/2021   CREATININE 1.00 01/16/2021   BUN 18 01/16/2021   CO2 26 01/16/2021   TSH 1.30 01/16/2021   PSA 0.77 01/16/2021   HGBA1C 6.0 01/16/2021   Assessment/Plan:  Mark Wilson is a 71 y.o. White or Caucasian [1] male with  has a past medical history of ALLERGIC RHINITIS (02/12/2008), Allergy, Arthritis, ASTHMA UNSPECIFIED WITH EXACERBATION (02/12/2008), HYPERLIPIDEMIA (  02/12/2008), and Impaired glucose tolerance (12/28/2010).  Ascending aorta dilatation (HCC) Stable, pt to continue f/u with surgury, cont coreg  Encounter for well adult exam with abnormal findings Age and sex appropriate  education and counseling updated with regular exercise and diet Referrals for preventative services - none needed Immunizations addressed - declines covid booster Smoking counseling  - counseled to quit cigars, pt not ready Evidence for depression or other mood disorder - none significant Most recent labs reviewed. I have personally reviewed and have noted: 1) the patient's medical and social history 2) The patient's current medications and supplements 3) The patient's height, weight, and BMI have been recorded in the chart   Pulmonary nodules Also for f/u with CT planned for aortic dilation f/u as well mar 2023  Aortic atherosclerosis ([place]) Pt to continue lipitor, low chol diet, excercise  Hyperlipidemia Lab Results  Component Value Date   LDLCALC 67 01/16/2021   Stable, pt to continue current statin lipitor 40   Impaired glucose tolerance Lab Results  Component Value Date   HGBA1C 6.0 01/16/2021   Stable, pt to continue current medical treatment  - diet   Tobacco abuse Persistent, counseled to quit, pt not ready  Low back pain Overall stable, for tramadol refill prn  Followup: Return in about 6 months (around 07/16/2021).  Cathlean Cower, MD 01/21/2021 8:01 PM [facility]

## 2021-01-16 NOTE — Patient Instructions (Signed)

## 2021-01-21 ENCOUNTER — Encounter: Payer: Self-pay | Admitting: Internal Medicine

## 2021-01-21 DIAGNOSIS — Z720 Tobacco use: Secondary | ICD-10-CM | POA: Insufficient documentation

## 2021-01-21 NOTE — Assessment & Plan Note (Signed)
Lab Results  Component Value Date   HGBA1C 6.0 01/16/2021   Stable, pt to continue current medical treatment  - diet

## 2021-01-21 NOTE — Assessment & Plan Note (Signed)
Pt to continue lipitor, low chol diet, excercise 

## 2021-01-21 NOTE — Assessment & Plan Note (Addendum)
Also for f/u with CT planned for aortic dilation f/u as well mar 2023

## 2021-01-21 NOTE — Assessment & Plan Note (Signed)
Age and sex appropriate education and counseling updated with regular exercise and diet Referrals for preventative services - none needed Immunizations addressed - declines covid booster Smoking counseling  - counseled to quit cigars, pt not ready Evidence for depression or other mood disorder - none significant Most recent labs reviewed. I have personally reviewed and have noted: 1) the patient's medical and social history 2) The patient's current medications and supplements 3) The patient's height, weight, and BMI have been recorded in the chart

## 2021-01-21 NOTE — Assessment & Plan Note (Signed)
Lab Results  Component Value Date   LDLCALC 67 01/16/2021   Stable, pt to continue current statin lipitor 40

## 2021-01-21 NOTE — Assessment & Plan Note (Signed)
Overall stable, for tramadol refill prn

## 2021-01-21 NOTE — Assessment & Plan Note (Signed)
Persistent, counseled to quit, pt not ready

## 2021-01-21 NOTE — Assessment & Plan Note (Addendum)
Stable, pt to continue f/u with surgury, cont coreg

## 2021-02-10 ENCOUNTER — Other Ambulatory Visit: Payer: Self-pay | Admitting: *Deleted

## 2021-02-10 DIAGNOSIS — R911 Solitary pulmonary nodule: Secondary | ICD-10-CM

## 2021-02-10 DIAGNOSIS — I7121 Aneurysm of the ascending aorta, without rupture: Secondary | ICD-10-CM

## 2021-04-05 ENCOUNTER — Ambulatory Visit
Admission: RE | Admit: 2021-04-05 | Discharge: 2021-04-05 | Disposition: A | Discharge status: Home / Self Care | Payer: No Typology Code available for payment source | Source: Ambulatory Visit | Attending: Surgery | Admitting: Surgery

## 2021-04-05 ENCOUNTER — Other Ambulatory Visit: Payer: Self-pay | Admitting: Internal Medicine

## 2021-04-05 ENCOUNTER — Encounter: Payer: Self-pay | Admitting: Surgery

## 2021-04-05 ENCOUNTER — Ambulatory Visit: Payer: No Typology Code available for payment source | Admitting: Surgery

## 2021-04-05 VITALS — BP 166/92 | HR 72 | Resp 20 | Ht 72.0 in | Wt 220.0 lb

## 2021-04-05 DIAGNOSIS — R911 Solitary pulmonary nodule: Secondary | ICD-10-CM

## 2021-04-05 DIAGNOSIS — I7121 Aneurysm of the ascending aorta, without rupture: Secondary | ICD-10-CM

## 2021-04-05 MED ORDER — IOPAMIDOL (ISOVUE-370) INJECTION 76%
75.0000 mL | Freq: Once | INTRAVENOUS | Status: AC | PRN
  Administered 2021-04-05: 75 mL via INTRAVENOUS

## 2021-04-05 NOTE — Progress Notes (Signed)
? ? ? ?HPI: ? ?The patient is a 71 year old gentleman who was initially seen in our office on 09/15/2020 for evaluation of a 4.6 cm fusiform ascending aortic aneurysm seen on a calcium scoring CT scan as well as a 6 x 6 mm left lower lobe pulmonary nodule.  He continues to remain active working full-time.  He reports an occasional left-sided chest pain but it is fleeting and not always related to exertion.  He did have a calcium score of 2567 which was in the 95th percentile.  He subsequently had a nuclear stress test on 11/09/2020 that was a low risk study. ? ?Current Outpatient Medications  ?Medication Sig Dispense Refill  ? albuterol (PROAIR HFA) 108 (90 Base) MCG/ACT inhaler Inhale 2 puffs into the lungs every 6 (six) hours as needed for wheezing or shortness of breath. 8.5 g 5  ? aspirin 81 MG tablet Take 81 mg by mouth daily.      ? atorvastatin (LIPITOR) 40 MG tablet Take 1 tablet (40 mg total) by mouth daily. 90 tablet 3  ? carvedilol (COREG) 3.125 MG tablet Take 1 tablet (3.125 mg total) by mouth 2 (two) times daily. 180 tablet 3  ? cyclobenzaprine (FLEXERIL) 5 MG tablet Take 1 tablet (5 mg total) by mouth 3 (three) times daily as needed for muscle spasms. 40 tablet 1  ? fish oil-omega-3 fatty acids 1000 MG capsule Take 1,200 mg by mouth daily.    ? Multiple Vitamins-Minerals (CENTRUM SILVER PO) Take 1 tablet by mouth daily.    ? naproxen (NAPROSYN) 500 MG tablet TAKE 1 TABLET(500 MG) BY MOUTH TWICE DAILY WITH A MEAL (Patient taking differently: Per patient, taking medication once daily) 60 tablet 0  ? omeprazole (PRILOSEC) 20 MG capsule Take 1 capsule (20 mg total) by mouth 2 (two) times daily before a meal. PATIENT NEEDS APPT PRIOR TO REFILLS 180 capsule 3  ? traMADol (ULTRAM) 50 MG tablet Take 1 tablet (50 mg total) by mouth every 8 (eight) hours as needed. 120 tablet 1  ? ?No current facility-administered medications for this visit.  ? ? ? ?Physical Exam: ?BP (!) 166/92 (BP Location: Right Arm, Patient  Position: Sitting)   Pulse 72   Resp 20   Ht 6' (1.829 m)   Wt 220 lb (99.8 kg)   SpO2 98% Comment: RA  BMI 29.84 kg/m?  ?He looks well. ?Cardiac exam shows regular rate and rhythm with normal heart sounds.  There is no murmur. ?Lungs are clear. ?There is no peripheral edema. ? ?Diagnostic Tests: ? ?Narrative & Impression  ?CLINICAL DATA:  Pulmonary nodule.  Aortic aneurysm. ?  ?EXAM: ?CT ANGIOGRAPHY CHEST WITH CONTRAST ?  ?TECHNIQUE: ?Multidetector CT imaging of the chest was performed using the ?standard protocol during bolus administration of intravenous ?contrast. Multiplanar CT image reconstructions and MIPs were ?obtained to evaluate the vascular anatomy. ?  ?RADIATION DOSE REDUCTION: This exam was performed according to the ?departmental dose-optimization program which includes automated ?exposure control, adjustment of the mA and/or kV according to ?patient size and/or use of iterative reconstruction technique. ?  ?CONTRAST:  69m ISOVUE-370 IOPAMIDOL (ISOVUE-370) INJECTION 76% ?  ?Creatinine was obtained [facility] [address] [facility] [employer] [facility] ?  ?Results: Creatinine 1.1 mg/dL. ?  ?COMPARISON:  Coronary calcium score cardiac CT 07/28/2020 ?  ?FINDINGS: ?Cardiovascular: Ascending thoracic aortic aneurysm 4.0 cm in ?diameter on image 80 series 4. No aortic dissection or acute ?thoracic aortic findings. ?  ?Aberrant right subclavian artery passes  behind the esophagus. ?  ?Coronary, aortic arch, and branch vessel atherosclerotic vascular ?disease. ?  ?Mediastinum/Nodes: Partially calcified left upper paratracheal ?lesion 2.6 by 1.6 by 2.7 cm. This could represent a septated cystic ?lesion given the pattern of calcification along a transverse portion ?of this lesion. This is not directly tangential to the thyroid gland ?although is near the thyroid gland. This is more lateral than I ?would expect for a thymoma. ?  ?Lungs/Pleura: Airway thickening is present, suggesting bronchitis  or ?reactive airways disease. ?  ?Right lower lobe subpleural nodule is 3 mm in thickness on image 79 ?series 8. 5 by 4 mm right subpleural nodule on image 75 series 6. ?  ?3 by 4 mm lingular nodule on image 96 series 6. ?  ?7 by 6 by 5 mm (volume = 100 mm^3) left lower lobe pulmonary ?nodule on image 118 series 6. ?  ?Upper Abdomen: Unremarkable ?  ?Musculoskeletal: Unremarkable ?  ?Review of the MIP images confirms the above findings. ?  ?IMPRESSION: ?1. Stable 100 cubic mm left lower lobe pulmonary nodule on image 118 ?series 6. Smaller right lower lobe pulmonary nodules are present. We ?have currently established 9 months of stability. According to ?current guidelines, a follow up chest CT is optional for low risk ?patients but recommended for high risk patients in 1 years time. ?This recommendation follows the consensus statement: Guidelines for ?Management of Incidental Pulmonary Nodules Detected on CT Images: ?From the Fleischner Society 2017; Radiology 2017; 284:228-243. ?2. Ascending aortic aneurysm 4.0 cm in diameter. Recommend annual ?imaging followup by CTA or MRA. This recommendation follows 2010 ?ACCF/AHA/AATS/ACR/ASA/SCA/SCAI/SIR/STS/SVM Guidelines for the ?Diagnosis and Management of Patients with Thoracic Aortic Disease. ?Circulation. 2010; 121: V564-P329. Aortic aneurysm NOS (ICD10-I71.9) ?3. Airway thickening is present, suggesting bronchitis or reactive ?airways disease. ?4.  Aortic Atherosclerosis (ICD10-I70.0).  Coronary atherosclerosis. ?5. Aberrant right subclavian artery passes behind the esophagus. ?  ?  ?Electronically Signed ?  By: Van Clines M.D. ?  On: 04/05/2021 10:08  ? ? ?Impression: ? ?He has a stable 4 cm fusiform ascending aortic aneurysm which was measured at 4.6 cm on his calcium scoring CT although there is no contrast given for that study.  This is well below the surgical threshold of 5.5 cm.  He also has stable small bilateral pulmonary nodules.  I reviewed the CT  images with him and answered all of his questions.  I stressed the importance of continued good blood pressure control in preventing further enlargement and acute aortic dissection.  I advised him against doing any heavy lifting that may require a Valsalva maneuver and could suddenly raise his blood pressure to high levels. ? ?Plan: ? ?I will see him back in 1 year with a CTA of the chest to follow-up on his ascending aortic aneurysm and small bilateral pulmonary nodules. ? ?I spent 20 minutes performing this established patient evaluation and > 50% of this time was spent face to face counseling and coordinating the care of this patient's aortic aneurysm.  ? ? ?Gaye Pollack, MD ?[facility] ?[phone] ? ? ? ? ? ? ?

## 2021-07-18 ENCOUNTER — Ambulatory Visit: Payer: No Typology Code available for payment source | Admitting: Internal Medicine

## 2021-07-18 VITALS — BP 138/80 | HR 79 | Temp 97.9°F | Ht 72.0 in | Wt 218.0 lb

## 2021-07-18 DIAGNOSIS — Z720 Tobacco use: Secondary | ICD-10-CM

## 2021-07-18 DIAGNOSIS — R918 Other nonspecific abnormal finding of lung field: Secondary | ICD-10-CM | POA: Diagnosis not present

## 2021-07-18 DIAGNOSIS — E7800 Pure hypercholesterolemia, unspecified: Secondary | ICD-10-CM | POA: Diagnosis not present

## 2021-07-18 DIAGNOSIS — R7302 Impaired glucose tolerance (oral): Secondary | ICD-10-CM

## 2021-07-18 DIAGNOSIS — Z125 Encounter for screening for malignant neoplasm of prostate: Secondary | ICD-10-CM

## 2021-07-18 DIAGNOSIS — E559 Vitamin D deficiency, unspecified: Secondary | ICD-10-CM

## 2021-07-18 DIAGNOSIS — R739 Hyperglycemia, unspecified: Secondary | ICD-10-CM

## 2021-07-18 DIAGNOSIS — I700 Atherosclerosis of aorta: Secondary | ICD-10-CM

## 2021-07-18 DIAGNOSIS — E538 Deficiency of other specified B group vitamins: Secondary | ICD-10-CM

## 2021-07-18 DIAGNOSIS — I77810 Thoracic aortic ectasia: Secondary | ICD-10-CM

## 2021-07-18 LAB — HEPATIC FUNCTION PANEL
ALT: 42 U/L (ref 0–53)
AST: 34 U/L (ref 0–37)
Albumin: 4.8 g/dL (ref 3.5–5.2)
Alkaline Phosphatase: 69 U/L (ref 39–117)
Bilirubin, Direct: 0.2 mg/dL (ref 0.0–0.3)
Total Bilirubin: 1.3 mg/dL — ABNORMAL HIGH (ref 0.2–1.2)
Total Protein: 7.3 g/dL (ref 6.0–8.3)

## 2021-07-18 LAB — LIPID PANEL
Cholesterol: 137 mg/dL (ref 0–200)
HDL: 38.2 mg/dL — ABNORMAL LOW (ref >39.00)
LDL Cholesterol: 67 mg/dL (ref 0–99)
NonHDL: 98.67
Total CHOL/HDL Ratio: 4
Triglycerides: 156 mg/dL — ABNORMAL HIGH (ref 0.0–149.0)
VLDL: 31.2 mg/dL (ref 0.0–40.0)

## 2021-07-18 LAB — HEMOGLOBIN A1C: Hgb A1c MFr Bld: 5.9 % (ref 4.6–6.5)

## 2021-07-18 LAB — BASIC METABOLIC PANEL
BUN: 21 mg/dL (ref 6–23)
CO2: 26 mEq/L (ref 19–32)
Calcium: 9.7 mg/dL (ref 8.4–10.5)
Chloride: 102 mEq/L (ref 96–112)
Creatinine, Ser: 1.01 mg/dL (ref 0.40–1.50)
GFR: 74.91 mL/min (ref >60.00)
Glucose, Bld: 129 mg/dL — ABNORMAL HIGH (ref 70–99)
Potassium: 4.6 mEq/L (ref 3.5–5.1)
Sodium: 137 mEq/L (ref 135–145)

## 2021-07-18 MED ORDER — NAPROXEN 500 MG PO TABS: 500.0000 mg | ORAL_TABLET | Freq: Two times a day (BID) | ORAL | 5 refills | Status: DC | PRN

## 2021-07-18 MED ORDER — OMEPRAZOLE 20 MG PO CPDR: 20.0000 mg | DELAYED_RELEASE_CAPSULE | Freq: Two times a day (BID) | ORAL | 3 refills | Status: DC

## 2021-07-18 MED ORDER — ALBUTEROL SULFATE HFA 108 (90 BASE) MCG/ACT IN AERS: 2.0000 | INHALATION_SPRAY | Freq: Four times a day (QID) | RESPIRATORY_TRACT | 5 refills | Status: DC | PRN

## 2021-07-18 MED ORDER — ATORVASTATIN CALCIUM 40 MG PO TABS: 40.0000 mg | ORAL_TABLET | Freq: Every day | ORAL | 3 refills | Status: DC

## 2021-07-18 NOTE — Assessment & Plan Note (Signed)
Stable at 9 mo in April 2023 - plan for f/u at 1 yr

## 2021-07-18 NOTE — Progress Notes (Signed)
Patient ID: Mark Wilson, male   DOB: [birth year], 71 y.o.   MRN: [id number]        Chief Complaint: follow up HTN, HLD and hyperglycemia , aortic atherosclerosis, low vit d, pulm nodule, ascending aortic dilation       HPI:  Mark Wilson is a 71 y.o. male here overall doing ok.  Still smoking, not ready to quit.  Pt denies chest pain, increased sob or doe, wheezing, orthopnea, PND, increased LE swelling, palpitations, dizziness or syncope.   Pt denies polydipsia, polyuria, or new focal neuro s/s.    Pt denies fever, wt loss, night sweats, loss of appetite, or other constitutional symptoms Losing wt with better diet.   Wt Readings from Last 3 Encounters:  07/18/21 218 lb (98.9 kg)  04/05/21 220 lb (99.8 kg)  01/16/21 224 lb (101.6 kg)   BP Readings from Last 3 Encounters:  07/18/21 138/80  04/05/21 (!) 166/92  01/16/21 132/80         Past Medical History:  Diagnosis Date   ALLERGIC RHINITIS 02/12/2008   Allergy    Arthritis    ASTHMA UNSPECIFIED WITH EXACERBATION 02/12/2008   HYPERLIPIDEMIA 02/12/2008   Impaired glucose tolerance 12/28/2010   Past Surgical History:  Procedure Laterality Date   TONSILLECTOMY      reports that he has been smoking cigars. He has quit using smokeless tobacco. He reports current alcohol use of about 8.0 standard drinks of alcohol per week. He reports that he does not use drugs. family history includes Breast cancer in his maternal grandmother; Depression in his mother; Diabetes in his maternal grandmother; Hypertension in his mother. Allergies  Allergen Reactions   Penicillins Shortness Of Breath   Current Outpatient Medications on File Prior to Visit  Medication Sig Dispense Refill   aspirin 81 MG tablet Take 81 mg by mouth daily.       carvedilol (COREG) 3.125 MG tablet Take 1 tablet (3.125 mg total) by mouth 2 (two) times daily. 180 tablet 3   cyclobenzaprine (FLEXERIL) 5 MG tablet Take 1 tablet (5 mg total) by mouth 3 (three) times daily as needed for  muscle spasms. 40 tablet 1   fish oil-omega-3 fatty acids 1000 MG capsule Take 1,200 mg by mouth daily.     Multiple Vitamins-Minerals (CENTRUM SILVER PO) Take 1 tablet by mouth daily.     traMADol (ULTRAM) 50 MG tablet Take 1 tablet (50 mg total) by mouth every 8 (eight) hours as needed. 120 tablet 1   No current facility-administered medications on file prior to visit.        ROS:  All others reviewed and negative.  Objective        PE:  BP 138/80 (BP Location: Left Arm, Patient Position: Sitting, Cuff Size: Large)   Pulse 79   Temp 97.9 F (36.6 C) (Oral)   Ht 6' (1.829 m)   Wt 218 lb (98.9 kg)   SpO2 93%   BMI 29.57 kg/m                 Constitutional: Pt appears in NAD               HENT: Head: NCAT.                Right Ear: External ear normal.                 Left Ear: External ear normal.  Eyes: . Pupils are equal, round, and reactive to light. Conjunctivae and EOM are normal               Nose: without d/c or deformity               Neck: Neck supple. Gross normal ROM               Cardiovascular: Normal rate and regular rhythm.                 Pulmonary/Chest: Effort normal and breath sounds without rales or wheezing.                Abd:  Soft, NT, ND, + BS, no organomegaly               Neurological: Pt is alert. At baseline orientation, motor grossly intact               Skin: Skin is warm. No rashes, no other new lesions, LE edema - none               Psychiatric: Pt behavior is normal without agitation   Micro: none  Cardiac tracings I have personally interpreted today:  none  Pertinent Radiological findings (summarize): none   Lab Results  Component Value Date   WBC 6.0 01/16/2021   HGB 15.8 01/16/2021   HCT 46.8 01/16/2021   PLT 169.0 01/16/2021   GLUCOSE 129 (H) 07/18/2021   CHOL 137 07/18/2021   TRIG 156.0 (H) 07/18/2021   HDL 38.20 (L) 07/18/2021   LDLDIRECT 86.0 04/21/2019   LDLCALC 67 07/18/2021   ALT 42 07/18/2021   AST 34  07/18/2021   NA 137 07/18/2021   K 4.6 07/18/2021   CL 102 07/18/2021   CREATININE 1.01 07/18/2021   BUN 21 07/18/2021   CO2 26 07/18/2021   TSH 1.30 01/16/2021   PSA 0.77 01/16/2021   HGBA1C 5.9 07/18/2021   Assessment/Plan:  Mark Wilson is a 71 y.o. White or Caucasian [1] male with  has a past medical history of ALLERGIC RHINITIS (02/12/2008), Allergy, Arthritis, ASTHMA UNSPECIFIED WITH EXACERBATION (02/12/2008), HYPERLIPIDEMIA (02/12/2008), and Impaired glucose tolerance (12/28/2010).  Pulmonary nodules Stable at 9 mo in April 2023 - plan for f/u at 1 yr  Ascending aorta dilatation (HCC) Stable at 9 mo in apr 2023 - plan for f/u at 1 yr  Tobacco abuse Now smoking only one cigar per weekend - encouraged to stop , pt not ready  Impaired glucose tolerance Lab Results  Component Value Date   HGBA1C 5.9 07/18/2021   Stable, pt to continue current medical treatment  - diet, wt control   Hyperlipidemia Lab Results  Component Value Date   [place] 67 07/18/2021   Stable, pt to continue current statin lipitor 40 qd   Aortic atherosclerosis (HCC) Pt to continue lo chol diet, exercise, lipitor 40 mg qd  Followup: No follow-ups on file.  Cathlean Cower, MD 07/22/2021 4:28 PM [facility]

## 2021-07-18 NOTE — Assessment & Plan Note (Signed)
Stable at 9 mo in apr 2023 - plan for f/u at 1 yr

## 2021-07-18 NOTE — Assessment & Plan Note (Signed)
Now smoking only one cigar per weekend - encouraged to stop , pt not ready

## 2021-07-18 NOTE — Patient Instructions (Signed)
Please continue all other medications as before, and refills have been done if requested.  Please have the pharmacy call with any other refills you may need.  Please continue your efforts at being more active, low cholesterol diet, and weight control.  Please keep your appointments with your specialists as you may have planned  Please go to the LAB at the blood drawing area for the tests to be done  You will be contacted by phone if any changes need to be made immediately.  Otherwise, you will receive a letter about your results with an explanation, but please check with MyChart first.  Please remember to sign up for MyChart if you have not done so, as this will be important to you in the future with finding out test results, communicating by private email, and scheduling acute appointments online when needed.  Please make an Appointment to return in 6 months, or sooner if needed, also with Lab Appointment for testing done 3-5 days before at the FIRST FLOOR Lab (so this is for TWO appointments - please see the scheduling desk as you leave)  

## 2021-07-22 ENCOUNTER — Encounter: Payer: Self-pay | Admitting: Internal Medicine

## 2021-07-22 NOTE — Assessment & Plan Note (Signed)
Pt to continue lo chol diet, exercise, lipitor 40 mg qd

## 2021-07-22 NOTE — Assessment & Plan Note (Signed)
Lab Results  Component Value Date   HGBA1C 5.9 07/18/2021   Stable, pt to continue current medical treatment  - diet, wt control

## 2021-07-22 NOTE — Assessment & Plan Note (Signed)
Lab Results  Component Value Date   LDLCALC 67 07/18/2021   Stable, pt to continue current statin lipitor 40 qd

## 2021-07-22 NOTE — Addendum Note (Signed)
Addended by: Biagio Borg on: 07/22/2021 04:30 PM   Modules accepted: Orders

## 2021-07-25 ENCOUNTER — Other Ambulatory Visit: Payer: Self-pay | Admitting: Interventional Cardiology

## 2021-07-25 NOTE — Telephone Encounter (Signed)
OK to refill.  Patient due for one year follow up in November 2023

## 2021-10-04 ENCOUNTER — Ambulatory Visit (INDEPENDENT_AMBULATORY_CARE_PROVIDER_SITE_OTHER): Payer: No Typology Code available for payment source

## 2021-10-04 DIAGNOSIS — Z23 Encounter for immunization: Secondary | ICD-10-CM | POA: Diagnosis not present

## 2021-10-04 NOTE — Progress Notes (Signed)
After obtaining consent, and per orders of Dr. Jenny Reichmann, injection of High Dose Flu given in the left deltoid by Marrian Salvage. Patient tolerated well and instructed to report any adverse reaction to me immediately.

## 2022-01-17 ENCOUNTER — Ambulatory Visit: Payer: No Typology Code available for payment source | Admitting: Internal Medicine

## 2022-01-17 VITALS — BP 136/82 | HR 76 | Temp 97.8°F | Ht 72.0 in | Wt 221.0 lb

## 2022-01-17 DIAGNOSIS — R739 Hyperglycemia, unspecified: Secondary | ICD-10-CM

## 2022-01-17 DIAGNOSIS — Z720 Tobacco use: Secondary | ICD-10-CM

## 2022-01-17 DIAGNOSIS — Z125 Encounter for screening for malignant neoplasm of prostate: Secondary | ICD-10-CM

## 2022-01-17 DIAGNOSIS — E7800 Pure hypercholesterolemia, unspecified: Secondary | ICD-10-CM | POA: Diagnosis not present

## 2022-01-17 DIAGNOSIS — Z0001 Encounter for general adult medical examination with abnormal findings: Secondary | ICD-10-CM | POA: Diagnosis not present

## 2022-01-17 DIAGNOSIS — I700 Atherosclerosis of aorta: Secondary | ICD-10-CM

## 2022-01-17 DIAGNOSIS — R7302 Impaired glucose tolerance (oral): Secondary | ICD-10-CM

## 2022-01-17 DIAGNOSIS — E538 Deficiency of other specified B group vitamins: Secondary | ICD-10-CM

## 2022-01-17 DIAGNOSIS — E559 Vitamin D deficiency, unspecified: Secondary | ICD-10-CM

## 2022-01-17 LAB — VITAMIN D 25 HYDROXY (VIT D DEFICIENCY, FRACTURES): VITD: 27.09 ng/mL — ABNORMAL LOW (ref 30.00–100.00)

## 2022-01-17 LAB — CBC WITH DIFFERENTIAL/PLATELET
Basophils Absolute: 0 10*3/uL (ref 0.0–0.1)
Basophils Relative: 0.9 % (ref 0.0–3.0)
Eosinophils Absolute: 0.3 10*3/uL (ref 0.0–0.7)
Eosinophils Relative: 5.3 % — ABNORMAL HIGH (ref 0.0–5.0)
HCT: 45.7 % (ref 39.0–52.0)
Hemoglobin: 15.8 g/dL (ref 13.0–17.0)
Lymphocytes Relative: 26.6 % (ref 12.0–46.0)
Lymphs Abs: 1.3 10*3/uL (ref 0.7–4.0)
MCHC: 34.6 g/dL (ref 30.0–36.0)
MCV: 94.1 fl (ref 78.0–100.0)
Monocytes Absolute: 0.5 10*3/uL (ref 0.1–1.0)
Monocytes Relative: 10.2 % (ref 3.0–12.0)
Neutro Abs: 2.7 10*3/uL (ref 1.4–7.7)
Neutrophils Relative %: 57 % (ref 43.0–77.0)
Platelets: 169 10*3/uL (ref 150.0–400.0)
RBC: 4.86 Mil/uL (ref 4.22–5.81)
RDW: 12.9 % (ref 11.5–15.5)
WBC: 4.8 10*3/uL (ref 4.0–10.5)

## 2022-01-17 LAB — URINALYSIS, ROUTINE W REFLEX MICROSCOPIC
Bilirubin Urine: NEGATIVE
Hgb urine dipstick: NEGATIVE
Ketones, ur: NEGATIVE
Leukocytes,Ua: NEGATIVE
Nitrite: NEGATIVE
RBC / HPF: NONE SEEN (ref 0–?)
Specific Gravity, Urine: 1.02 (ref 1.000–1.030)
Total Protein, Urine: NEGATIVE
Urine Glucose: NEGATIVE
Urobilinogen, UA: 0.2 (ref 0.0–1.0)
pH: 6 (ref 5.0–8.0)

## 2022-01-17 LAB — BASIC METABOLIC PANEL
BUN: 15 mg/dL (ref 6–23)
CO2: 30 mEq/L (ref 19–32)
Calcium: 9.6 mg/dL (ref 8.4–10.5)
Chloride: 102 mEq/L (ref 96–112)
Creatinine, Ser: 1 mg/dL (ref 0.40–1.50)
GFR: 75.55 mL/min (ref >60.00)
Glucose, Bld: 133 mg/dL — ABNORMAL HIGH (ref 70–99)
Potassium: 4.5 mEq/L (ref 3.5–5.1)
Sodium: 138 mEq/L (ref 135–145)

## 2022-01-17 LAB — LIPID PANEL
Cholesterol: 118 mg/dL (ref 0–200)
HDL: 34.6 mg/dL — ABNORMAL LOW (ref >39.00)
LDL Cholesterol: 62 mg/dL (ref 0–99)
NonHDL: 83.45
Total CHOL/HDL Ratio: 3
Triglycerides: 109 mg/dL (ref 0.0–149.0)
VLDL: 21.8 mg/dL (ref 0.0–40.0)

## 2022-01-17 LAB — HEPATIC FUNCTION PANEL
ALT: 53 U/L (ref 0–53)
AST: 40 U/L — ABNORMAL HIGH (ref 0–37)
Albumin: 4.6 g/dL (ref 3.5–5.2)
Alkaline Phosphatase: 80 U/L (ref 39–117)
Bilirubin, Direct: 0.3 mg/dL (ref 0.0–0.3)
Total Bilirubin: 1.2 mg/dL (ref 0.2–1.2)
Total Protein: 7.1 g/dL (ref 6.0–8.3)

## 2022-01-17 LAB — VITAMIN B12: Vitamin B-12: 375 pg/mL (ref 211–911)

## 2022-01-17 LAB — HEMOGLOBIN A1C: Hgb A1c MFr Bld: 5.9 % (ref 4.6–6.5)

## 2022-01-17 LAB — TSH: TSH: 1.11 u[IU]/mL (ref 0.35–5.50)

## 2022-01-17 LAB — PSA: PSA: 0.96 ng/mL (ref 0.10–4.00)

## 2022-01-17 NOTE — Patient Instructions (Addendum)
Please continue all other medications as before, and refills have been done if requested.  Please have the pharmacy call with any other refills you may need.  Please continue your efforts at being more active, low cholesterol diet, and weight control.  You are otherwise up to date with prevention measures today.  Please keep your appointments with your specialists as you may have planned  Please go to the LAB at the blood drawing area for the tests to be done  You will be contacted by phone if any changes need to be made immediately.  Otherwise, you will receive a letter about your results with an explanation, but please check with MyChart first.  Please remember to sign up for MyChart if you have not done so, as this will be important to you in the future with finding out test results, communicating by private email, and scheduling acute appointments online when needed.  Please make an Appointment to return in 6 months, or sooner if needed, also with Lab Appointment for testing done 3-5 days before at the [place] (so this is for TWO appointments - please see the scheduling desk as you leave)

## 2022-01-17 NOTE — Progress Notes (Signed)
Patient ID: Mark Wilson, male   DOB: [birth year], 72 y.o.   MRN: [id number]         Chief Complaint:: wellness exam and aortic atherosclerosis, hld, hyperglycemia, smoker       HPI:  Mark Wilson is a 72 y.o. male here for wellness exam; declines covid booster, o/w  up to date; still smoking, not ready to quit.                        Also Pt denies chest pain, increased sob or doe, wheezing, orthopnea, PND, increased LE swelling, palpitations, dizziness or syncope.   Pt denies polydipsia, polyuria, or new focal neuro s/s.    Pt denies fever, wt loss, night sweats, loss of appetite, or other constitutional symptoms  Not taking Vit D   Wt Readings from Last 3 Encounters:  01/17/22 221 lb (100.2 kg)  07/18/21 218 lb (98.9 kg)  04/05/21 220 lb (99.8 kg)   BP Readings from Last 3 Encounters:  01/17/22 136/82  07/18/21 138/80  04/05/21 (!) 166/92   Immunization History  Administered Date(s) Administered   Fluad Quad(high Dose 65+) 09/20/2018, 10/09/2019, 09/30/2020, 10/04/2021   H1N1 02/12/2008   Influenza Split 09/25/2010, 11/05/2011   Influenza Whole 10/02/2007, 11/21/2009   Influenza, High Dose Seasonal PF 09/25/2016, 09/17/2017   Influenza,inj,Quad PF,6+ Mos 10/24/2013, 10/22/2014   PFIZER Comirnaty(Gray Top)Covid-19 Tri-Sucrose Vaccine 05/21/2020   PFIZER(Purple Top)SARS-COV-2 Vaccination 04/20/2019, 10/17/2019, 10/01/2020   Pneumococcal Conjugate-13 03/11/2018   Pneumococcal Polysaccharide-23 11/01/2015   Td 01/02/2004   Tdap 03/14/2016   Zoster Recombinat (Shingrix) 10/16/2020, 12/22/2020   Zoster, Live 09/25/2010  There are no preventive care reminders to display for this patient.    Past Medical History:  Diagnosis Date   ALLERGIC RHINITIS 02/12/2008   Allergy    Arthritis    ASTHMA UNSPECIFIED WITH EXACERBATION 02/12/2008   HYPERLIPIDEMIA 02/12/2008   Impaired glucose tolerance 12/28/2010   Past Surgical History:  Procedure Laterality Date   TONSILLECTOMY      reports  that he has been smoking cigars. He has quit using smokeless tobacco. He reports current alcohol use of about 8.0 standard drinks of alcohol per week. He reports that he does not use drugs. family history includes Breast cancer in his maternal grandmother; Depression in his mother; Diabetes in his maternal grandmother; Hypertension in his mother. Allergies  Allergen Reactions   Penicillins Shortness Of Breath   Current Outpatient Medications on File Prior to Visit  Medication Sig Dispense Refill   albuterol (PROAIR HFA) 108 (90 Base) MCG/ACT inhaler Inhale 2 puffs into the lungs every 6 (six) hours as needed for wheezing or shortness of breath. 8.5 g 5   aspirin 81 MG tablet Take 81 mg by mouth daily.       atorvastatin (LIPITOR) 40 MG tablet Take 1 tablet (40 mg total) by mouth daily. 90 tablet 3   carvedilol (COREG) 3.125 MG tablet TAKE 1 TABLET(3.125 MG) BY MOUTH TWICE DAILY 180 tablet 1   cyclobenzaprine (FLEXERIL) 5 MG tablet Take 1 tablet (5 mg total) by mouth 3 (three) times daily as needed for muscle spasms. 40 tablet 1   fish oil-omega-3 fatty acids 1000 MG capsule Take 1,200 mg by mouth daily.     Multiple Vitamins-Minerals (CENTRUM SILVER PO) Take 1 tablet by mouth daily.     naproxen (NAPROSYN) 500 MG tablet Take 1 tablet (500 mg total) by mouth 2 (two) times daily as needed. 60 tablet 5  omeprazole (PRILOSEC) 20 MG capsule Take 1 capsule (20 mg total) by mouth 2 (two) times daily before a meal. 180 capsule 3   traMADol (ULTRAM) 50 MG tablet Take 1 tablet (50 mg total) by mouth every 8 (eight) hours as needed. 120 tablet 1   No current facility-administered medications on file prior to visit.        ROS:  All others reviewed and negative.  Objective        PE:  BP 136/82 (BP Location: Left Arm, Patient Position: Sitting, Cuff Size: Large)   Pulse 76   Temp 97.8 F (36.6 C) (Oral)   Ht 6' (1.829 m)   Wt 221 lb (100.2 kg)   SpO2 96%   BMI 29.97 kg/m                  Constitutional: Pt appears in NAD               HENT: Head: NCAT.                Right Ear: External ear normal.                 Left Ear: External ear normal.                Eyes: . Pupils are equal, round, and reactive to light. Conjunctivae and EOM are normal               Nose: without d/c or deformity               Neck: Neck supple. Gross normal ROM               Cardiovascular: Normal rate and regular rhythm.                 Pulmonary/Chest: Effort normal and breath sounds without rales or wheezing.                Abd:  Soft, NT, ND, + BS, no organomegaly               Neurological: Pt is alert. At baseline orientation, motor grossly intact               Skin: Skin is warm. No rashes, no other new lesions, LE edema - none               Psychiatric: Pt behavior is normal without agitation   Micro: none  Cardiac tracings I have personally interpreted today:  none  Pertinent Radiological findings (summarize): none   Lab Results  Component Value Date   WBC 4.8 01/17/2022   HGB 15.8 01/17/2022   HCT 45.7 01/17/2022   PLT 169.0 01/17/2022   GLUCOSE 133 (H) 01/17/2022   CHOL 118 01/17/2022   TRIG 109.0 01/17/2022   HDL 34.60 (L) 01/17/2022   LDLDIRECT 86.0 04/21/2019   LDLCALC 62 01/17/2022   ALT 53 01/17/2022   AST 40 (H) 01/17/2022   NA 138 01/17/2022   K 4.5 01/17/2022   CL 102 01/17/2022   CREATININE 1.00 01/17/2022   BUN 15 01/17/2022   CO2 30 01/17/2022   TSH 1.11 01/17/2022   PSA 0.96 01/17/2022   HGBA1C 5.9 01/17/2022   Assessment/Plan:  Mark Wilson is a 72 y.o. White or Caucasian [1] male with  has a past medical history of ALLERGIC RHINITIS (02/12/2008), Allergy, Arthritis, ASTHMA UNSPECIFIED WITH EXACERBATION (02/12/2008), HYPERLIPIDEMIA (02/12/2008), and Impaired glucose tolerance (12/28/2010).  Encounter for  well adult exam with abnormal findings Age and sex appropriate education and counseling updated with regular exercise and diet Referrals for  preventative services -  Immunizations addressed - declines covid booster Smoking counseling  - none needed Evidence for depression or other mood disorder - none significant Most recent labs reviewed. I have personally reviewed and have noted: 1) the patient's medical and social history 2) The patient's current medications and supplements 3) The patient's height, weight, and BMI have been recorded in the chart   Aortic atherosclerosis [facility]) Lab Results  Component Value Date   LDLCALC 62 01/17/2022   Stable, pt to continue current statin lipitor, exercise, low chol diet  Hyperlipidemia Lab Results  Component Value Date   LDLCALC 62 01/17/2022   Stable, pt to continue current statin lipitor 40 mg qd   Impaired glucose tolerance Lab Results  Component Value Date   HGBA1C 5.9 01/17/2022   Stable, pt to continue current medical treatment  - diet, excercise   Tobacco abuse Pt counsled to quit, pt not ready  Vitamin D deficiency Last vitamin D Lab Results  Component Value Date   VD25OH 27.09 (L) 01/17/2022   Low, to start oral replacement  Followup: Return in about 6 months (around 07/18/2022).  Cathlean Cower, MD 01/18/2022 9:57 PM [facility]

## 2022-01-18 ENCOUNTER — Encounter: Payer: Self-pay | Admitting: Internal Medicine

## 2022-01-18 DIAGNOSIS — E559 Vitamin D deficiency, unspecified: Secondary | ICD-10-CM | POA: Insufficient documentation

## 2022-01-18 NOTE — Assessment & Plan Note (Signed)
Lab Results  Component Value Date   HGBA1C 5.9 01/17/2022   Stable, pt to continue current medical treatment  - diet, excercise

## 2022-01-18 NOTE — Assessment & Plan Note (Signed)
Last vitamin D Lab Results  Component Value Date   VD25OH 27.09 (L) 01/17/2022   Low, to start oral replacement

## 2022-01-18 NOTE — Assessment & Plan Note (Signed)
Lab Results  Component Value Date   LDLCALC 62 01/17/2022   Stable, pt to continue current statin lipitor 40 mg qd

## 2022-01-18 NOTE — Assessment & Plan Note (Signed)
Pt counsled to quit, pt not ready °

## 2022-01-18 NOTE — Assessment & Plan Note (Signed)
Lab Results  Component Value Date   LDLCALC 62 01/17/2022   Stable, pt to continue current statin lipitor, exercise, low chol diet

## 2022-01-18 NOTE — Assessment & Plan Note (Signed)
Age and sex appropriate education and counseling updated with regular exercise and diet Referrals for preventative services -  Immunizations addressed - declines covid booster Smoking counseling  - none needed Evidence for depression or other mood disorder - none significant Most recent labs reviewed. I have personally reviewed and have noted: 1) the patient's medical and social history 2) The patient's current medications and supplements 3) The patient's height, weight, and BMI have been recorded in the chart

## 2022-03-12 ENCOUNTER — Other Ambulatory Visit: Payer: Self-pay | Admitting: Surgery

## 2022-03-12 DIAGNOSIS — I7121 Aneurysm of the ascending aorta, without rupture: Secondary | ICD-10-CM

## 2022-04-03 ENCOUNTER — Encounter: Payer: Self-pay | Admitting: Thoracic Surgery (Cardiothoracic Vascular Surgery)

## 2022-04-17 ENCOUNTER — Ambulatory Visit
Admission: RE | Admit: 2022-04-17 | Discharge: 2022-04-17 | Disposition: A | Discharge status: Home / Self Care | Payer: No Typology Code available for payment source | Source: Ambulatory Visit | Attending: Thoracic Surgery (Cardiothoracic Vascular Surgery) | Admitting: Thoracic Surgery (Cardiothoracic Vascular Surgery)

## 2022-04-17 DIAGNOSIS — I7121 Aneurysm of the ascending aorta, without rupture: Secondary | ICD-10-CM

## 2022-04-17 MED ORDER — IOPAMIDOL (ISOVUE-370) INJECTION 76%
75.0000 mL | Freq: Once | INTRAVENOUS | Status: AC | PRN
  Administered 2022-04-17: 75 mL via INTRAVENOUS

## 2022-04-18 ENCOUNTER — Ambulatory Visit: Payer: No Typology Code available for payment source | Admitting: Surgery

## 2022-04-18 ENCOUNTER — Encounter: Payer: Self-pay | Admitting: Surgery

## 2022-04-18 VITALS — BP 150/90 | HR 87 | Resp 20 | Ht 72.0 in | Wt 223.0 lb

## 2022-04-18 DIAGNOSIS — I7121 Aneurysm of the ascending aorta, without rupture: Secondary | ICD-10-CM | POA: Diagnosis not present

## 2022-04-18 NOTE — Progress Notes (Signed)
    HPI: ***  Current Outpatient Medications  Medication Sig Dispense Refill   albuterol (PROAIR HFA) 108 (90 Base) MCG/ACT inhaler Inhale 2 puffs into the lungs every 6 (six) hours as needed for wheezing or shortness of breath. 8.5 g 5   aspirin 81 MG tablet Take 81 mg by mouth daily.       atorvastatin (LIPITOR) 40 MG tablet Take 1 tablet (40 mg total) by mouth daily. 90 tablet 3   carvedilol (COREG) 3.125 MG tablet TAKE 1 TABLET(3.125 MG) BY MOUTH TWICE DAILY 180 tablet 1   cyclobenzaprine (FLEXERIL) 5 MG tablet Take 1 tablet (5 mg total) by mouth 3 (three) times daily as needed for muscle spasms. 40 tablet 1   fish oil-omega-3 fatty acids 1000 MG capsule Take 1,200 mg by mouth daily.     Multiple Vitamins-Minerals (CENTRUM SILVER PO) Take 1 tablet by mouth daily.     naproxen (NAPROSYN) 500 MG tablet Take 1 tablet (500 mg total) by mouth 2 (two) times daily as needed. 60 tablet 5   omeprazole (PRILOSEC) 20 MG capsule Take 1 capsule (20 mg total) by mouth 2 (two) times daily before a meal. 180 capsule 3   traMADol (ULTRAM) 50 MG tablet Take 1 tablet (50 mg total) by mouth every 8 (eight) hours as needed. 120 tablet 1   No current facility-administered medications for this visit.     Physical Exam: ***  Diagnostic Tests: ***  Impression: ***  Plan: ***   Alleen Borne, MD [facility] [phone]

## 2022-04-26 ENCOUNTER — Other Ambulatory Visit: Payer: Self-pay | Admitting: Interventional Cardiology

## 2022-05-26 ENCOUNTER — Other Ambulatory Visit: Payer: Self-pay | Admitting: Interventional Cardiology

## 2022-05-29 ENCOUNTER — Other Ambulatory Visit: Payer: Self-pay | Admitting: Interventional Cardiology

## 2022-06-25 ENCOUNTER — Other Ambulatory Visit: Payer: Self-pay | Admitting: Interventional Cardiology

## 2022-06-25 MED ORDER — CARVEDILOL 3.125 MG PO TABS: 3.1250 mg | ORAL_TABLET | Freq: Two times a day (BID) | ORAL | 0 refills | Status: DC

## 2022-06-25 NOTE — Addendum Note (Signed)
Addended by: Mariam Dollar on: 06/25/2022 06:46 AM   Modules accepted: Orders

## 2022-07-18 ENCOUNTER — Ambulatory Visit: Payer: No Typology Code available for payment source | Admitting: Internal Medicine

## 2022-07-18 VITALS — BP 148/90 | HR 77 | Temp 98.2°F | Ht 73.0 in | Wt 230.0 lb

## 2022-07-18 DIAGNOSIS — E7800 Pure hypercholesterolemia, unspecified: Secondary | ICD-10-CM

## 2022-07-18 DIAGNOSIS — Z720 Tobacco use: Secondary | ICD-10-CM | POA: Diagnosis not present

## 2022-07-18 DIAGNOSIS — J4520 Mild intermittent asthma, uncomplicated: Secondary | ICD-10-CM

## 2022-07-18 DIAGNOSIS — Z125 Encounter for screening for malignant neoplasm of prostate: Secondary | ICD-10-CM

## 2022-07-18 DIAGNOSIS — E559 Vitamin D deficiency, unspecified: Secondary | ICD-10-CM | POA: Diagnosis not present

## 2022-07-18 DIAGNOSIS — I2510 Atherosclerotic heart disease of native coronary artery without angina pectoris: Secondary | ICD-10-CM

## 2022-07-18 DIAGNOSIS — I10 Essential (primary) hypertension: Secondary | ICD-10-CM

## 2022-07-18 DIAGNOSIS — E538 Deficiency of other specified B group vitamins: Secondary | ICD-10-CM

## 2022-07-18 DIAGNOSIS — R7302 Impaired glucose tolerance (oral): Secondary | ICD-10-CM | POA: Diagnosis not present

## 2022-07-18 MED ORDER — CARVEDILOL 6.25 MG PO TABS: 6.2500 mg | ORAL_TABLET | Freq: Two times a day (BID) | ORAL | 3 refills | Status: DC

## 2022-07-18 NOTE — Patient Instructions (Addendum)
Ok to increase the coreg to 6.25 mg twice per day  [facility] for the increased Vit D3 as discussed  Please continue all other medications as before, and refills have been done if requested.  Please have the pharmacy call with any other refills you may need.  Please continue your efforts at being more active, low cholesterol diet, and weight control.  Please keep your appointments with your specialists as you may have planned  You will be contacted regarding the referral for: Nutrition, and Cardiology  Please make an Appointment to return in 6 months, or sooner if needed, also with Lab Appointment for testing done 3-5 days before at the FIRST FLOOR Lab (so this is for TWO appointments - please see the scheduling desk as you leave)

## 2022-07-18 NOTE — Progress Notes (Signed)
Patient ID: Mark Wilson, male   DOB: 11-19-1950, 72 y.o.   MRN: [id number]        Chief Complaint: follow up HTN, HLD and hyperglycemia, low vit d       HPI:  Mark Wilson is a 72 y.o. male here overall doing ok, Pt denies chest pain, increased sob or doe, wheezing, orthopnea, PND, increased LE swelling, palpitations, dizziness or syncope.   Pt denies polydipsia, polyuria, or new focal neuro s/s.    Pt denies fever, wt loss, night sweats, loss of appetite, or other constitutional symptoms  Still smoking, not ready to quit  Wt Readings from Last 3 Encounters:  07/18/22 230 lb (104.3 kg)  04/18/22 223 lb (101.2 kg)  01/17/22 221 lb (100.2 kg)   BP Readings from Last 3 Encounters:  07/18/22 (!) 148/90  04/18/22 (!) 150/90  01/17/22 136/82         Past Medical History:  Diagnosis Date   ALLERGIC RHINITIS 02/12/2008   Allergy    Arthritis    ASTHMA UNSPECIFIED WITH EXACERBATION 02/12/2008   HYPERLIPIDEMIA 02/12/2008   Impaired glucose tolerance 12/28/2010   Past Surgical History:  Procedure Laterality Date   TONSILLECTOMY      reports that he has been smoking cigars. He has quit using smokeless tobacco. He reports current alcohol use of about 8.0 standard drinks of alcohol per week. He reports that he does not use drugs. family history includes Breast cancer in his maternal grandmother; Depression in his mother; Diabetes in his maternal grandmother; Hypertension in his mother. Allergies  Allergen Reactions   Penicillins Shortness Of Breath   Current Outpatient Medications on File Prior to Visit  Medication Sig Dispense Refill   albuterol (PROAIR HFA) 108 (90 Base) MCG/ACT inhaler Inhale 2 puffs into the lungs every 6 (six) hours as needed for wheezing or shortness of breath. 8.5 g 5   aspirin 81 MG tablet Take 81 mg by mouth daily.       atorvastatin (LIPITOR) 40 MG tablet Take 1 tablet (40 mg total) by mouth daily. 90 tablet 3   cyclobenzaprine (FLEXERIL) 5 MG tablet Take 1 tablet (5 mg  total) by mouth 3 (three) times daily as needed for muscle spasms. 40 tablet 1   fish oil-omega-3 fatty acids 1000 MG capsule Take 1,200 mg by mouth daily.     Multiple Vitamins-Minerals (CENTRUM SILVER PO) Take 1 tablet by mouth daily.     naproxen (NAPROSYN) 500 MG tablet Take 1 tablet (500 mg total) by mouth 2 (two) times daily as needed. 60 tablet 5   omeprazole (PRILOSEC) 20 MG capsule Take 1 capsule (20 mg total) by mouth 2 (two) times daily before a meal. 180 capsule 3   traMADol (ULTRAM) 50 MG tablet Take 1 tablet (50 mg total) by mouth every 8 (eight) hours as needed. 120 tablet 1   No current facility-administered medications on file prior to visit.        ROS:  All others reviewed and negative.  Objective        PE:  BP (!) 148/90 (BP Location: Left Arm, Patient Position: Sitting, Cuff Size: Normal)   Pulse 77   Temp 98.2 F (36.8 C) (Oral)   Ht 6\' 1"  (1.854 m)   Wt 230 lb (104.3 kg)   SpO2 94%   BMI 30.34 kg/m                 Constitutional: Pt appears in NAD  HENT: Head: NCAT.                Right Ear: External ear normal.                 Left Ear: External ear normal.                Eyes: . Pupils are equal, round, and reactive to light. Conjunctivae and EOM are normal               Nose: without d/c or deformity               Neck: Neck supple. Gross normal ROM               Cardiovascular: Normal rate and regular rhythm.                 Pulmonary/Chest: Effort normal and breath sounds without rales or wheezing.                Abd:  Soft, NT, ND, + BS, no organomegaly               Neurological: Pt is alert. At baseline orientation, motor grossly intact               Skin: Skin is warm. No rashes, no other new lesions, LE edema - none               Psychiatric: Pt behavior is normal without agitation   Micro: none  Cardiac tracings I have personally interpreted today:  none  Pertinent Radiological findings (summarize): none   Lab Results   Component Value Date   WBC 4.8 01/17/2022   HGB 15.8 01/17/2022   HCT 45.7 01/17/2022   PLT 169.0 01/17/2022   GLUCOSE 133 (H) 01/17/2022   CHOL 118 01/17/2022   TRIG 109.0 01/17/2022   HDL 34.60 (L) 01/17/2022   LDLDIRECT 86.0 04/21/2019   LDLCALC 62 01/17/2022   ALT 53 01/17/2022   AST 40 (H) 01/17/2022   NA 138 01/17/2022   K 4.5 01/17/2022   CL 102 01/17/2022   CREATININE 1.00 01/17/2022   BUN 15 01/17/2022   CO2 30 01/17/2022   TSH 1.11 01/17/2022   PSA 0.96 01/17/2022   HGBA1C 5.9 01/17/2022   Assessment/Plan:  Mark Wilson is a 72 y.o. White or Caucasian [1] male with  has a past medical history of ALLERGIC RHINITIS (02/12/2008), Allergy, Arthritis, ASTHMA UNSPECIFIED WITH EXACERBATION (02/12/2008), HYPERLIPIDEMIA (02/12/2008), and Impaired glucose tolerance (12/28/2010).  Hyperlipidemia Lab Results  Component Value Date   LDLCALC 62 01/17/2022   Stable, pt to continue current statin liptior 40 qd  Impaired glucose tolerance Lab Results  Component Value Date   HGBA1C 5.9 01/17/2022   Stable, pt to continue current medical treatment  - diet, wt control and refer nutrition   Tobacco abuse Pt counsled to quit, pt not ready  Vitamin D deficiency Last vitamin D Lab Results  Component Value Date   VD25OH 27.09 (L) 01/17/2022   Low, to start oral replacement   Asthma Stable, continue inhaler prn  HTN (hypertension) BP Readings from Last 3 Encounters:  07/18/22 (!) 148/90  04/18/22 (!) 150/90  01/17/22 136/82   Uncontrolled, pt to increased coreg 6.25 bid  Followup: Return in about 6 months (around 01/18/2023).  Mark Barre, MD 07/21/2022 4:22 PM [facility]

## 2022-07-21 ENCOUNTER — Encounter: Payer: Self-pay | Admitting: Internal Medicine

## 2022-07-21 DIAGNOSIS — I10 Essential (primary) hypertension: Secondary | ICD-10-CM | POA: Insufficient documentation

## 2022-07-21 NOTE — Assessment & Plan Note (Signed)
BP Readings from Last 3 Encounters:  07/18/22 (!) 148/90  04/18/22 (!) 150/90  01/17/22 136/82   Uncontrolled, pt to increased coreg 6.25 bid

## 2022-07-21 NOTE — Assessment & Plan Note (Signed)
Last vitamin D Lab Results  Component Value Date   VD25OH 27.09 (L) 01/17/2022   Low, to start oral replacement

## 2022-07-21 NOTE — Assessment & Plan Note (Addendum)
Lab Results  Component Value Date   HGBA1C 5.9 01/17/2022   Stable, pt to continue current medical treatment  - diet, wt control and refer nutrition

## 2022-07-21 NOTE — Assessment & Plan Note (Signed)
Lab Results  Component Value Date   LDLCALC 62 01/17/2022   Stable, pt to continue current statin liptior 40 qd

## 2022-07-21 NOTE — Assessment & Plan Note (Signed)
Stable, continue inhaler prn 

## 2022-07-21 NOTE — Assessment & Plan Note (Signed)
Pt counsled to quit, pt not ready °

## 2022-08-28 ENCOUNTER — Other Ambulatory Visit: Payer: Self-pay

## 2022-08-28 MED ORDER — ATORVASTATIN CALCIUM 40 MG PO TABS: 40.0000 mg | ORAL_TABLET | Freq: Every day | ORAL | 3 refills | Status: DC

## 2022-09-18 ENCOUNTER — Other Ambulatory Visit: Payer: Self-pay | Admitting: Internal Medicine

## 2022-10-04 ENCOUNTER — Ambulatory Visit (INDEPENDENT_AMBULATORY_CARE_PROVIDER_SITE_OTHER): Payer: No Typology Code available for payment source

## 2022-10-04 DIAGNOSIS — Z23 Encounter for immunization: Secondary | ICD-10-CM

## 2022-10-04 NOTE — Progress Notes (Signed)
Patient presented in office today for his HD Flu Vaccine. HD Flu Vaccine was administered into his Left Deltoid Muscle. Patient tolerated injection well and injection site looked fine. Patient advised to report to the office immediately if he notices any adverse reaction.

## 2023-01-07 ENCOUNTER — Other Ambulatory Visit (INDEPENDENT_AMBULATORY_CARE_PROVIDER_SITE_OTHER): Payer: No Typology Code available for payment source

## 2023-01-07 ENCOUNTER — Ambulatory Visit: Payer: No Typology Code available for payment source

## 2023-01-07 ENCOUNTER — Other Ambulatory Visit: Payer: No Typology Code available for payment source

## 2023-01-07 DIAGNOSIS — E538 Deficiency of other specified B group vitamins: Secondary | ICD-10-CM | POA: Diagnosis not present

## 2023-01-07 DIAGNOSIS — E559 Vitamin D deficiency, unspecified: Secondary | ICD-10-CM

## 2023-01-07 DIAGNOSIS — R7302 Impaired glucose tolerance (oral): Secondary | ICD-10-CM

## 2023-01-07 DIAGNOSIS — Z125 Encounter for screening for malignant neoplasm of prostate: Secondary | ICD-10-CM | POA: Diagnosis not present

## 2023-01-07 DIAGNOSIS — E7800 Pure hypercholesterolemia, unspecified: Secondary | ICD-10-CM | POA: Diagnosis not present

## 2023-01-07 LAB — HEPATIC FUNCTION PANEL
ALT: 40 U/L (ref 0–53)
AST: 30 U/L (ref 0–37)
Albumin: 4.6 g/dL (ref 3.5–5.2)
Alkaline Phosphatase: 75 U/L (ref 39–117)
Bilirubin, Direct: 0.2 mg/dL (ref 0.0–0.3)
Total Bilirubin: 1.1 mg/dL (ref 0.2–1.2)
Total Protein: 6.7 g/dL (ref 6.0–8.3)

## 2023-01-07 LAB — CBC WITH DIFFERENTIAL/PLATELET
Basophils Absolute: 0.1 10*3/uL (ref 0.0–0.1)
Basophils Relative: 1.2 % (ref 0.0–3.0)
Eosinophils Absolute: 0.3 10*3/uL (ref 0.0–0.7)
Eosinophils Relative: 5.1 % — ABNORMAL HIGH (ref 0.0–5.0)
HCT: 45.8 % (ref 39.0–52.0)
Hemoglobin: 15.5 g/dL (ref 13.0–17.0)
Lymphocytes Relative: 23.4 % (ref 12.0–46.0)
Lymphs Abs: 1.3 10*3/uL (ref 0.7–4.0)
MCHC: 33.9 g/dL (ref 30.0–36.0)
MCV: 95.7 fL (ref 78.0–100.0)
Monocytes Absolute: 0.6 10*3/uL (ref 0.1–1.0)
Monocytes Relative: 10.6 % (ref 3.0–12.0)
Neutro Abs: 3.2 10*3/uL (ref 1.4–7.7)
Neutrophils Relative %: 59.7 % (ref 43.0–77.0)
Platelets: 160 10*3/uL (ref 150.0–400.0)
RBC: 4.79 Mil/uL (ref 4.22–5.81)
RDW: 13.1 % (ref 11.5–15.5)
WBC: 5.4 10*3/uL (ref 4.0–10.5)

## 2023-01-07 LAB — URINALYSIS, ROUTINE W REFLEX MICROSCOPIC
Bilirubin Urine: NEGATIVE
Hgb urine dipstick: NEGATIVE
Ketones, ur: NEGATIVE
Leukocytes,Ua: NEGATIVE
Nitrite: NEGATIVE
Specific Gravity, Urine: 1.02 (ref 1.000–1.030)
Total Protein, Urine: NEGATIVE
Urine Glucose: NEGATIVE
Urobilinogen, UA: 0.2 (ref 0.0–1.0)
pH: 6.5 (ref 5.0–8.0)

## 2023-01-07 LAB — BASIC METABOLIC PANEL
BUN: 17 mg/dL (ref 6–23)
CO2: 27 meq/L (ref 19–32)
Calcium: 9.5 mg/dL (ref 8.4–10.5)
Chloride: 103 meq/L (ref 96–112)
Creatinine, Ser: 1.08 mg/dL (ref 0.40–1.50)
GFR: 68.42 mL/min (ref >60.00)
Glucose, Bld: 136 mg/dL — ABNORMAL HIGH (ref 70–99)
Potassium: 4.8 meq/L (ref 3.5–5.1)
Sodium: 140 meq/L (ref 135–145)

## 2023-01-07 LAB — LIPID PANEL
Cholesterol: 132 mg/dL (ref 0–200)
HDL: 31.2 mg/dL — ABNORMAL LOW (ref >39.00)
LDL Cholesterol: 71 mg/dL (ref 0–99)
NonHDL: 100.58
Total CHOL/HDL Ratio: 4
Triglycerides: 147 mg/dL (ref 0.0–149.0)
VLDL: 29.4 mg/dL (ref 0.0–40.0)

## 2023-01-07 LAB — VITAMIN D 25 HYDROXY (VIT D DEFICIENCY, FRACTURES): VITD: 56.56 ng/mL (ref 30.00–100.00)

## 2023-01-07 LAB — MICROALBUMIN / CREATININE URINE RATIO
Creatinine,U: 72.2 mg/dL
Microalb Creat Ratio: 1 mg/g (ref 0.0–30.0)
Microalb, Ur: <0.7 mg/dL (ref 0.0–1.9)

## 2023-01-07 LAB — TSH: TSH: 1.4 u[IU]/mL (ref 0.35–5.50)

## 2023-01-07 LAB — HEMOGLOBIN A1C: Hgb A1c MFr Bld: 6.1 % (ref 4.6–6.5)

## 2023-01-07 LAB — PSA: PSA: 0.78 ng/mL (ref 0.10–4.00)

## 2023-01-07 LAB — VITAMIN B12: Vitamin B-12: 1510 pg/mL — ABNORMAL HIGH (ref 211–911)

## 2023-01-21 ENCOUNTER — Ambulatory Visit: Payer: No Typology Code available for payment source | Admitting: Internal Medicine

## 2023-01-21 VITALS — BP 120/80 | HR 75 | Temp 97.8°F | Ht 73.0 in | Wt 220.0 lb

## 2023-01-21 DIAGNOSIS — E7800 Pure hypercholesterolemia, unspecified: Secondary | ICD-10-CM | POA: Diagnosis not present

## 2023-01-21 DIAGNOSIS — B351 Tinea unguium: Secondary | ICD-10-CM | POA: Diagnosis not present

## 2023-01-21 DIAGNOSIS — E538 Deficiency of other specified B group vitamins: Secondary | ICD-10-CM | POA: Diagnosis not present

## 2023-01-21 DIAGNOSIS — I10 Essential (primary) hypertension: Secondary | ICD-10-CM | POA: Diagnosis not present

## 2023-01-21 DIAGNOSIS — E559 Vitamin D deficiency, unspecified: Secondary | ICD-10-CM

## 2023-01-21 DIAGNOSIS — Z720 Tobacco use: Secondary | ICD-10-CM

## 2023-01-21 DIAGNOSIS — R7302 Impaired glucose tolerance (oral): Secondary | ICD-10-CM

## 2023-01-21 DIAGNOSIS — Z0001 Encounter for general adult medical examination with abnormal findings: Secondary | ICD-10-CM

## 2023-01-21 DIAGNOSIS — Z0000 Encounter for general adult medical examination without abnormal findings: Secondary | ICD-10-CM

## 2023-01-21 MED ORDER — ATORVASTATIN CALCIUM 80 MG PO TABS: 80.0000 mg | ORAL_TABLET | Freq: Every day | ORAL | 3 refills | Status: DC

## 2023-01-21 MED ORDER — CICLOPIROX 8 % EX SOLN: Freq: Every day | CUTANEOUS | 11 refills | Status: DC

## 2023-01-21 NOTE — Progress Notes (Unsigned)
Patient ID: Maceo Bernhagen, male   DOB: January 08, 1950, 73 y.o.   MRN: [id number]         Chief Complaint:: wellness exam and htn, hld, hypergycemia, smoker, low vit d       HPI:  Nathan Pinson is a 73 y.o. male here for wellness exam; declies covid booster, o/w up to date  Still smoking, not ready to quit                        Also Pt denies chest pain, increased sob or doe, wheezing, orthopnea, PND, increased LE swelling, palpitations, dizziness or syncope.   Pt denies polydipsia, polyuria, or new focal neuro s/s.    Pt denies fever, wt loss, night sweats, loss of appetite, or other constitutional symptoms  Has worsening fungal nails to most toes in the past 6 mo.    Wt Readings from Last 3 Encounters:  01/21/23 220 lb (99.8 kg)  07/18/22 230 lb (104.3 kg)  04/18/22 223 lb (101.2 kg)   BP Readings from Last 3 Encounters:  01/21/23 120/80  07/18/22 (!) 148/90  04/18/22 (!) 150/90   Immunization History  Administered Date(s) Administered   Fluad Quad(high Dose 65+) 09/20/2018, 10/09/2019, 09/30/2020, 10/04/2021   Fluad Trivalent(High Dose 65+) 10/04/2022   H1N1 02/12/2008   Influenza Split 09/25/2010, 11/05/2011   Influenza Whole 10/02/2007, 11/21/2009   Influenza, High Dose Seasonal PF 09/25/2016, 09/17/2017   Influenza,inj,Quad PF,6+ Mos 10/24/2013, 10/22/2014   PFIZER Comirnaty(Gray Top)Covid-19 Tri-Sucrose Vaccine 05/21/2020   PFIZER(Purple Top)SARS-COV-2 Vaccination 04/20/2019, 10/17/2019, 10/01/2020   Pneumococcal Conjugate-13 03/11/2018   Pneumococcal Polysaccharide-23 11/01/2015   Td 01/02/2004   Tdap 03/14/2016   Zoster Recombinant(Shingrix) 10/16/2020, 12/22/2020   Zoster, Live 09/25/2010   Health Maintenance Due  Topic Date Due   COVID-19 Vaccine (5 - 2024-25 season) 09/02/2022      Past Medical History:  Diagnosis Date   ALLERGIC RHINITIS 02/12/2008   Allergy    Arthritis    ASTHMA UNSPECIFIED WITH EXACERBATION 02/12/2008   HYPERLIPIDEMIA 02/12/2008   Impaired glucose  tolerance 12/28/2010   Past Surgical History:  Procedure Laterality Date   TONSILLECTOMY      reports that he has been smoking cigars. He has quit using smokeless tobacco. He reports current alcohol use of about 8.0 standard drinks of alcohol per week. He reports that he does not use drugs. family history includes Breast cancer in his maternal grandmother; Depression in his mother; Diabetes in his maternal grandmother; Hypertension in his mother. Allergies  Allergen Reactions   Penicillins Shortness Of Breath   Current Outpatient Medications on File Prior to Visit  Medication Sig Dispense Refill   albuterol (PROAIR HFA) 108 (90 Base) MCG/ACT inhaler Inhale 2 puffs into the lungs every 6 (six) hours as needed for wheezing or shortness of breath. 8.5 g 5   aspirin 81 MG tablet Take 81 mg by mouth daily.       carvedilol (COREG) 6.25 MG tablet Take 1 tablet (6.25 mg total) by mouth 2 (two) times daily with a meal. 180 tablet 3   cyclobenzaprine (FLEXERIL) 5 MG tablet Take 1 tablet (5 mg total) by mouth 3 (three) times daily as needed for muscle spasms. 40 tablet 1   fish oil-omega-3 fatty acids 1000 MG capsule Take 1,200 mg by mouth daily.     Multiple Vitamins-Minerals (CENTRUM SILVER PO) Take 1 tablet by mouth daily.     naproxen (NAPROSYN) 500 MG tablet Take 1 tablet (500  mg total) by mouth 2 (two) times daily as needed. 60 tablet 5   omeprazole (PRILOSEC) 20 MG capsule TAKE 1 CAPSULE(20 MG) BY MOUTH TWICE DAILY BEFORE A MEAL 180 capsule 3   traMADol (ULTRAM) 50 MG tablet Take 1 tablet (50 mg total) by mouth every 8 (eight) hours as needed. 120 tablet 1   No current facility-administered medications on file prior to visit.        ROS:  All others reviewed and negative.  Objective        PE:  BP 120/80 (BP Location: Left Arm, Patient Position: Sitting, Cuff Size: Normal)   Pulse 75   Temp 97.8 F (36.6 C) (Oral)   Ht 6\' 1"  (1.854 m)   Wt 220 lb (99.8 kg)   SpO2 97%   BMI 29.03  kg/m                 Constitutional: Pt appears in NAD               HENT: Head: NCAT.                Right Ear: External ear normal.                 Left Ear: External ear normal.                Eyes: . Pupils are equal, round, and reactive to light. Conjunctivae and EOM are normal               Nose: without d/c or deformity               Neck: Neck supple. Gross normal ROM               Cardiovascular: Normal rate and regular rhythm.                 Pulmonary/Chest: Effort normal and breath sounds without rales or wheezing.                Abd:  Soft, NT, ND, + BS, no organomegaly               Neurological: Pt is alert. At baseline orientation, motor grossly intact               Skin: Skin is warm. No rashes, no other new lesions, LE edema - none               Psychiatric: Pt behavior is normal without agitation   Micro: none  Cardiac tracings I have personally interpreted today:  none  Pertinent Radiological findings (summarize): none   Lab Results  Component Value Date   WBC 5.4 01/07/2023   HGB 15.5 01/07/2023   HCT 45.8 01/07/2023   PLT 160.0 01/07/2023   GLUCOSE 136 (H) 01/07/2023   CHOL 132 01/07/2023   TRIG 147.0 01/07/2023   HDL 31.20 (L) 01/07/2023   LDLDIRECT 86.0 04/21/2019   LDLCALC 71 01/07/2023   ALT 40 01/07/2023   AST 30 01/07/2023   NA 140 01/07/2023   K 4.8 01/07/2023   CL 103 01/07/2023   CREATININE 1.08 01/07/2023   BUN 17 01/07/2023   CO2 27 01/07/2023   TSH 1.40 01/07/2023   PSA 0.78 01/07/2023   HGBA1C 6.1 01/07/2023   MICROALBUR <0.7 01/07/2023   Assessment/Plan:  Mazon Smedley is a 73 y.o. White or Caucasian [1] male with  has a past medical history of ALLERGIC RHINITIS (02/12/2008), Allergy,  Arthritis, ASTHMA UNSPECIFIED WITH EXACERBATION (02/12/2008), HYPERLIPIDEMIA (02/12/2008), and Impaired glucose tolerance (12/28/2010).  Encounter for well adult exam with abnormal findings Age and sex appropriate education and counseling updated with  regular exercise and diet Referrals for preventative services - none needed Immunizations addressed - declines covid booster Smoking counseling  - pt counsled to quit, pt not ready Evidence for depression or other mood disorder - none significant Most recent labs reviewed. I have personally reviewed and have noted: 1) the patient's medical and social history 2) The patient's current medications and supplements 3) The patient's height, weight, and BMI have been recorded in the chart   HTN (hypertension) BP Readings from Last 3 Encounters:  01/21/23 120/80  07/18/22 (!) 148/90  04/18/22 (!) 150/90   Stable, pt to continue medical treatment coreg 6.25 bid   Hyperlipidemia Lab Results  Component Value Date   LDLCALC 71 01/07/2023   uncontrolled, pt for increased lpitor 80 qd   Impaired glucose tolerance Lab Results  Component Value Date   HGBA1C 6.1 01/07/2023   Stable, pt to continue current medical treatment  - diet, wt control   Tobacco abuse Pt counsled to quit, pt not reaady  Vitamin D deficiency Last vitamin D Lab Results  Component Value Date   VD25OH 56.56 01/07/2023   Stable, cont oral replacement   B12 deficiency Lab Results  Component Value Date   VITAMINB12 1,510 (H) 01/07/2023  Now overcontrolled, for decreased b12 oral to mon - wed - fri  Onychomycosis Worsening in last 6 mo - for penlac asd  Followup: Return in about 6 months (around 07/21/2023).  Oliver Barre, MD 01/23/2023 7:29 AM [facility] - [facility] Medicine

## 2023-01-21 NOTE — Patient Instructions (Addendum)
Ok to increase the lipitor to 80 mg per day  Please take all new medication as prescribed - the penlac  Ok to cut back the B12 to 3 times per wk  Please continue all other medications as before, and refills have been done if requested.  Please have the pharmacy call with any other refills you may need.  Please continue your efforts at being more active, low cholesterol diet, and weight control.  You are otherwise up to date with prevention measures today.  Please keep your appointments with your specialists as you may have planned  Please make an Appointment to return in 6 months, or sooner if needed, also with Lab Appointment for testing done 3-5 days before at the FIRST FLOOR Lab (so this is for TWO appointments - please see the scheduling desk as you leave)

## 2023-01-23 ENCOUNTER — Encounter: Payer: Self-pay | Admitting: Internal Medicine

## 2023-01-23 DIAGNOSIS — E538 Deficiency of other specified B group vitamins: Secondary | ICD-10-CM | POA: Insufficient documentation

## 2023-01-23 DIAGNOSIS — B351 Tinea unguium: Secondary | ICD-10-CM | POA: Insufficient documentation

## 2023-01-23 NOTE — Assessment & Plan Note (Signed)
Worsening in last 6 mo - for penlac asd

## 2023-01-23 NOTE — Assessment & Plan Note (Signed)
Lab Results  Component Value Date   VITAMINB12 1,510 (H) 01/07/2023  Now overcontrolled, for decreased b12 oral to mon - wed - fri

## 2023-01-23 NOTE — Assessment & Plan Note (Signed)
 Age and sex appropriate education and counseling updated with regular exercise and diet Referrals for preventative services - none needed Immunizations addressed - declines covid booster Smoking counseling  - pt counsled to quit, pt not ready Evidence for depression or other mood disorder - none significant Most recent labs reviewed. I have personally reviewed and have noted: 1) the patient's medical and social history 2) The patient's current medications and supplements 3) The patient's height, weight, and BMI have been recorded in the chart

## 2023-01-23 NOTE — Assessment & Plan Note (Signed)
Lab Results  Component Value Date   LDLCALC 71 01/07/2023   uncontrolled, pt for increased lpitor 80 qd

## 2023-01-23 NOTE — Assessment & Plan Note (Signed)
Pt counsled to quit, pt not reaady

## 2023-01-23 NOTE — Assessment & Plan Note (Signed)
BP Readings from Last 3 Encounters:  01/21/23 120/80  07/18/22 (!) 148/90  04/18/22 (!) 150/90   Stable, pt to continue medical treatment coreg 6.25 bid

## 2023-01-23 NOTE — Assessment & Plan Note (Signed)
Lab Results  Component Value Date   HGBA1C 6.1 01/07/2023   Stable, pt to continue current medical treatment  - diet, wt control

## 2023-01-23 NOTE — Assessment & Plan Note (Signed)
Last vitamin D Lab Results  Component Value Date   VD25OH 56.56 01/07/2023   Stable, cont oral replacement

## 2023-03-03 ENCOUNTER — Other Ambulatory Visit: Payer: Self-pay | Admitting: Internal Medicine

## 2023-03-04 ENCOUNTER — Other Ambulatory Visit: Payer: Self-pay

## 2023-03-08 ENCOUNTER — Other Ambulatory Visit: Payer: Self-pay | Admitting: Surgery

## 2023-03-08 DIAGNOSIS — I7121 Aneurysm of the ascending aorta, without rupture: Secondary | ICD-10-CM

## 2023-04-16 ENCOUNTER — Encounter: Payer: Self-pay | Admitting: Surgery

## 2023-04-23 ENCOUNTER — Other Ambulatory Visit

## 2023-04-30 ENCOUNTER — Ambulatory Visit

## 2023-05-03 ENCOUNTER — Ambulatory Visit (INDEPENDENT_AMBULATORY_CARE_PROVIDER_SITE_OTHER): Admitting: Family

## 2023-05-03 VITALS — BP 130/88 | HR 83 | Temp 98.5°F | Ht 73.0 in | Wt 217.0 lb

## 2023-05-03 DIAGNOSIS — J208 Acute bronchitis due to other specified organisms: Secondary | ICD-10-CM | POA: Diagnosis not present

## 2023-05-03 DIAGNOSIS — R051 Acute cough: Secondary | ICD-10-CM | POA: Diagnosis not present

## 2023-05-03 MED ORDER — METHYLPREDNISOLONE 4 MG PO TBPK: ORAL_TABLET | ORAL | 0 refills | Status: DC

## 2023-05-03 MED ORDER — PROMETHAZINE-DM 6.25-15 MG/5ML PO SYRP: 5.0000 mL | ORAL_SOLUTION | Freq: Four times a day (QID) | ORAL | 0 refills | Status: DC | PRN

## 2023-05-03 NOTE — Progress Notes (Signed)
 Acute Office Visit  Subjective:     Patient ID: Mark Wilson, male    DOB: [birth year], 73 y.o.   MRN: [id number]  Chief Complaint  Patient presents with  . Cough    Patient states started Sunday and seems to be getting worse. Chest and nasal congestion, body ache, face pressure, headaches, no fever, no sore throat. He did do a  covid test on wed and yesterday it was negative. Clear mucus. He is alos having trouble sleeping And states he is delsym which isnt helping.     HPI Patient is in today with complaints of cough and chest congestion x 6 days.  Cough is productive with clear mucus.  Denies any fever or chills.  Has been taking Delsym and Alka-Seltzer that has not helped his symptoms much.  Has taken 2 rapid COVID test that were both negative.  Denies any known sick contacts.  Review of Systems  Constitutional:  Negative for chills and fever.  HENT: Negative.    Respiratory:  Positive for cough, sputum production and wheezing. Negative for shortness of breath.   Cardiovascular: Negative.   Musculoskeletal: Negative.   Skin: Negative.   Neurological: Negative.   Psychiatric/Behavioral: Negative.    All other systems reviewed and are negative.      Objective:    BP 130/88 (BP Location: Left Arm, Patient Position: Sitting, Cuff Size: Normal)   Pulse 83   Temp 98.5 F (36.9 C) (Oral)   Ht 6\' 1"  (1.854 m)   Wt 217 lb (98.4 kg)   SpO2 95%   BMI 28.63 kg/m    Physical Exam Vitals and nursing note reviewed.  Constitutional:      Appearance: Normal appearance. He is normal weight.  HENT:     Right Ear: Tympanic membrane, ear canal and external ear normal.     Left Ear: Tympanic membrane, ear canal and external ear normal.     Nose: Nose normal.  Cardiovascular:     Rate and Rhythm: Normal rate and regular rhythm.  Pulmonary:     Effort: Pulmonary effort is normal.     Breath sounds: Normal breath sounds. No wheezing or rales.  Musculoskeletal:        General: Normal  range of motion.     Cervical back: Normal range of motion and neck supple.  Skin:    General: Skin is warm and dry.  Neurological:     General: No focal deficit present.     Mental Status: He is alert and oriented to person, place, and time. Mental status is at baseline.  Psychiatric:        Mood and Affect: Mood normal.        Behavior: Behavior normal.        Thought Content: Thought content normal.   No results found for any visits on 05/03/23.      Assessment & Plan:   Problem List Items Addressed This Visit   None Visit Diagnoses       Viral bronchitis    -  Primary     Acute cough           Meds ordered this encounter  Medications  . methylPREDNISolone (MEDROL DOSEPAK) 4 MG TBPK tablet    Sig: As directed    Dispense:  21 tablet    Refill:  0  . promethazine-dextromethorphan (PROMETHAZINE-DM) 6.25-15 MG/5ML syrup    Sig: Take 5 mLs by mouth 4 (four) times daily as needed.  Dispense:  118 mL    Refill:  0   Call the office if symptoms worsen or persist.  Recheck as scheduled or sooner if needed. No follow-ups on file.  Tien Aispuro B Leyton Magoon, FNP

## 2023-05-06 ENCOUNTER — Ambulatory Visit: Payer: Self-pay

## 2023-05-06 ENCOUNTER — Encounter: Payer: Self-pay | Admitting: Internal Medicine

## 2023-05-06 ENCOUNTER — Ambulatory Visit (INDEPENDENT_AMBULATORY_CARE_PROVIDER_SITE_OTHER): Admitting: Internal Medicine

## 2023-05-06 VITALS — BP 154/88 | HR 68 | Temp 98.2°F | Ht 73.0 in | Wt 216.4 lb

## 2023-05-06 DIAGNOSIS — J019 Acute sinusitis, unspecified: Secondary | ICD-10-CM | POA: Diagnosis not present

## 2023-05-06 DIAGNOSIS — E559 Vitamin D deficiency, unspecified: Secondary | ICD-10-CM | POA: Diagnosis not present

## 2023-05-06 DIAGNOSIS — J4520 Mild intermittent asthma, uncomplicated: Secondary | ICD-10-CM

## 2023-05-06 DIAGNOSIS — I10 Essential (primary) hypertension: Secondary | ICD-10-CM | POA: Diagnosis not present

## 2023-05-06 DIAGNOSIS — J309 Allergic rhinitis, unspecified: Secondary | ICD-10-CM | POA: Diagnosis not present

## 2023-05-06 MED ORDER — AZITHROMYCIN 250 MG PO TABS: ORAL_TABLET | ORAL | 1 refills | Status: AC

## 2023-05-06 MED ORDER — HYDROCODONE BIT-HOMATROP MBR 5-1.5 MG/5ML PO SOLN: 5.0000 mL | Freq: Four times a day (QID) | ORAL | 0 refills | Status: AC | PRN

## 2023-05-06 NOTE — Assessment & Plan Note (Signed)
Mild to mod, for antibx course zpack, cough med prn,  to f/u any worsening symptoms or concerns 

## 2023-05-06 NOTE — Assessment & Plan Note (Signed)
 Last vitamin D Lab Results  Component Value Date   VD25OH 56.56 01/07/2023   Stable, cont oral replacement

## 2023-05-06 NOTE — Patient Instructions (Signed)
 Please take all new medication as prescribed - the antibiotic, and cough medicine as needed  Please continue all other medications as before, and refills have been done if requested.  Please have the pharmacy call with any other refills you may need.  Please keep your appointments with your specialists as you may have planned

## 2023-05-06 NOTE — Assessment & Plan Note (Addendum)
 BP Readings from Last 3 Encounters:  05/06/23 (!) 154/88  05/03/23 130/88  01/21/23 120/80   Uncontrolled, likely reactive, pt to continue medical treatment coreg  6.25 bid

## 2023-05-06 NOTE — Assessment & Plan Note (Signed)
Stable overall, cont inhaler prn 

## 2023-05-06 NOTE — Assessment & Plan Note (Signed)
 Improved , pt to finish prednisone

## 2023-05-06 NOTE — Telephone Encounter (Signed)
 Copied from CRM [id number]. Topic: Clinical - Red Word Triage >> May 06, 2023 10:36 AM [place] E wrote: [facility] that prompted transfer to Nurse Triage: Patient's wife, Adell Hones, called in stating that patient has a worsening cough that has been going on for 2 weeks, patient is coughing up yellow phlegm.  Chief Complaint: cough with yellow phlegm Symptoms: cough, yellow phlegm Frequency: for two weeks Pertinent Negatives: Patient denies cp, sob Disposition: [] ED /[] Urgent Care (no appt availability in office) / [x] Appointment(In office/virtual)/ []  [facility]/ [] Home Care/ [] Refused Recommended Disposition /[] [facility]/ []  Follow-up with PCP Additional Notes: per protocol apt made for today; care advice given, denies questions; instructed to go to ER if becomes worse.   Reason for Disposition  SEVERE coughing spells (e.g., whooping sound after coughing, vomiting after coughing)  Answer Assessment - Initial Assessment Questions 1. ONSET: "When did the cough begin?"      Two weeks ago 2. SEVERITY: "How bad is the cough today?"      Severe; does not sleep at night 3. SPUTUM: "Describe the color of your sputum" (none, dry cough; clear, white, yellow, green)     yellow 4. HEMOPTYSIS: "Are you coughing up any blood?" If so ask: "How much?" (flecks, streaks, tablespoons, etc.)     no 5. DIFFICULTY BREATHING: "Are you having difficulty breathing?" If Yes, ask: "How bad is it?" (e.g., mild, moderate, severe)    - MILD: No SOB at rest, mild SOB with walking, speaks normally in sentences, can lie down, no retractions, pulse < 100.    - MODERATE: SOB at rest, SOB with minimal exertion and prefers to sit, cannot lie down flat, speaks in phrases, mild retractions, audible wheezing, pulse 100-120.    - SEVERE: Very SOB at rest, speaks in single words, struggling to breathe, sitting hunched forward, retractions, pulse > 120      no 6. FEVER: "Do you have a fever?" If Yes, ask:  "What is your temperature, how was it measured, and when did it start?"     no 7. CARDIAC HISTORY: "Do you have any history of heart disease?" (e.g., heart attack, congestive heart failure)      no 8. LUNG HISTORY: "Do you have any history of lung disease?"  (e.g., pulmonary embolus, asthma, emphysema)     asthma 9. PE RISK FACTORS: "Do you have a history of blood clots?" (or: recent major surgery, recent prolonged travel, bedridden)     no 10. OTHER SYMPTOMS: "Do you have any other symptoms?" (e.g., runny nose, wheezing, chest pain)       Runny nose,  11. PREGNANCY: "Is there any chance you are pregnant?" "When was your last menstrual period?"       no 12. TRAVEL: "Have you traveled out of the country in the last month?" (e.g., travel history, exposures)       no  Protocols used: Cough - Acute Productive-A-AH

## 2023-05-06 NOTE — Progress Notes (Signed)
 Patient ID: Mark Wilson, male   DOB: [birth year], 73 y.o.   MRN: [id number]        Chief Complaint: follow up sinusitis, allergies,htn, low vit d, asthma       HPI:  Mark Wilson is a 73 y.o. male  Here with 2-3 days acute onset fever, facial pain, pressure, headache, general weakness and malaise, and greenish d/c, with mild ST and cough, but pt denies chest pain, wheezing, increased sob or doe, orthopnea, PND, increased LE swelling, palpitations, dizziness or syncope.  Was seen recently with tx for allergies now improved.   Pt denies polydipsia, polyuria, or new focal neuro s/s.    Pt denies recent wt loss, night sweats, loss of appetite, or other constitutional symptoms       Wt Readings from Last 3 Encounters:  05/06/23 216 lb 6 oz (98.1 kg)  05/03/23 217 lb (98.4 kg)  01/21/23 220 lb (99.8 kg)   BP Readings from Last 3 Encounters:  05/06/23 (!) 154/88  05/03/23 130/88  01/21/23 120/80         Past Medical History:  Diagnosis Date   ALLERGIC RHINITIS 02/12/2008   Allergy    Arthritis    ASTHMA UNSPECIFIED WITH EXACERBATION 02/12/2008   HYPERLIPIDEMIA 02/12/2008   Impaired glucose tolerance 12/28/2010   Past Surgical History:  Procedure Laterality Date   TONSILLECTOMY      reports that he has been smoking cigars. He has quit using smokeless tobacco. He reports current alcohol use of about 8.0 standard drinks of alcohol per week. He reports that he does not use drugs. family history includes Breast cancer in his maternal grandmother; Depression in his mother; Diabetes in his maternal grandmother; Hypertension in his mother. Allergies  Allergen Reactions   Penicillins Shortness Of Breath   Current Outpatient Medications on File Prior to Visit  Medication Sig Dispense Refill   albuterol  (PROAIR  HFA) 108 (90 Base) MCG/ACT inhaler Inhale 2 puffs into the lungs every 6 (six) hours as needed for wheezing or shortness of breath. 8.5 g 5   aspirin 81 MG tablet Take 81 mg by mouth daily.        atorvastatin  (LIPITOR) 80 MG tablet Take 1 tablet (80 mg total) by mouth daily. 90 tablet 3   carvedilol  (COREG ) 6.25 MG tablet Take 1 tablet (6.25 mg total) by mouth 2 (two) times daily with a meal. 180 tablet 3   ciclopirox  (PENLAC ) 8 % solution Apply topically at bedtime. Apply over nail and surrounding skin. Apply daily over previous coat. After seven (7) days, may remove with alcohol and continue cycle. 6.6 mL 11   cyclobenzaprine  (FLEXERIL ) 5 MG tablet Take 1 tablet (5 mg total) by mouth 3 (three) times daily as needed for muscle spasms. 40 tablet 1   fish oil-omega-3 fatty acids 1000 MG capsule Take 1,200 mg by mouth daily.     methylPREDNISolone (MEDROL DOSEPAK) 4 MG TBPK tablet As directed 21 tablet 0   Multiple Vitamins-Minerals (CENTRUM SILVER PO) Take 1 tablet by mouth daily.     naproxen  (NAPROSYN ) 500 MG tablet TAKE 1 TABLET(500 MG) BY MOUTH TWICE DAILY AS NEEDED 60 tablet 5   omeprazole  (PRILOSEC) 20 MG capsule TAKE 1 CAPSULE(20 MG) BY MOUTH TWICE DAILY BEFORE A MEAL 180 capsule 3   promethazine-dextromethorphan (PROMETHAZINE-DM) 6.25-15 MG/5ML syrup Take 5 mLs by mouth 4 (four) times daily as needed. 118 mL 0   traMADol  (ULTRAM ) 50 MG tablet Take 1 tablet (50 mg total) by mouth every  8 (eight) hours as needed. 120 tablet 1   No current facility-administered medications on file prior to visit.        ROS:  All others reviewed and negative.  Objective        PE:  BP (!) 154/88   Pulse 68   Temp 98.2 F (36.8 C) (Temporal)   Ht 6\' 1"  (1.854 m)   Wt 216 lb 6 oz (98.1 kg)   SpO2 94%   BMI 28.55 kg/m                 Constitutional: Pt appears in NAD               HENT: Head: NCAT.                Right Ear: External ear normal.                 Left Ear: External ear normal.                Eyes: . Pupils are equal, round, and reactive to light. Conjunctivae and EOM are normal               Nose: without d/c or deformity               Neck: Neck supple. Gross normal ROM                Cardiovascular: Normal rate and regular rhythm.                 Pulmonary/Chest: Effort normal and breath sounds without rales or wheezing.                Abd:  Soft, NT, ND, + BS, no organomegaly               Neurological: Pt is alert. At baseline orientation, motor grossly intact               Skin: Skin is warm. No rashes, no other new lesions, LE edema - none               Psychiatric: Pt behavior is normal without agitation   Micro: none  Cardiac tracings I have personally interpreted today:  none  Pertinent Radiological findings (summarize): none   Lab Results  Component Value Date   WBC 5.4 01/07/2023   HGB 15.5 01/07/2023   HCT 45.8 01/07/2023   PLT 160.0 01/07/2023   GLUCOSE 136 (H) 01/07/2023   CHOL 132 01/07/2023   TRIG 147.0 01/07/2023   HDL 31.20 (L) 01/07/2023   LDLDIRECT 86.0 04/21/2019   LDLCALC 71 01/07/2023   ALT 40 01/07/2023   AST 30 01/07/2023   NA 140 01/07/2023   K 4.8 01/07/2023   CL 103 01/07/2023   CREATININE 1.08 01/07/2023   BUN 17 01/07/2023   CO2 27 01/07/2023   TSH 1.40 01/07/2023   PSA 0.78 01/07/2023   HGBA1C 6.1 01/07/2023   MICROALBUR <0.7 01/07/2023   Assessment/Plan:  Mark Wilson is a 73 y.o. White or Caucasian [1] male with  has a past medical history of ALLERGIC RHINITIS (02/12/2008), Allergy, Arthritis, ASTHMA UNSPECIFIED WITH EXACERBATION (02/12/2008), HYPERLIPIDEMIA (02/12/2008), and Impaired glucose tolerance (12/28/2010).  Acute sinus infection Mild to mod, for antibx course zpack, cough med prn, to f/u any worsening symptoms or concerns  Allergic rhinitis Improved , pt to finish prednisone  Asthma Stable overall, cont inhaler prn  HTN (hypertension) BP Readings from Last  3 Encounters:  05/06/23 (!) 154/88  05/03/23 130/88  01/21/23 120/80   Uncontrolled, likely reactive, pt to continue medical treatment coreg  6.25 bid   Vitamin D  deficiency Last vitamin D  Lab Results  Component Value Date   VD25OH  56.56 01/07/2023   Stable, cont oral replacement  Followup: Return if symptoms worsen or fail to improve.  Mark Colonel, MD 05/06/2023 7:26 PM [facility] - [facility] Medicine

## 2023-05-10 ENCOUNTER — Telehealth: Payer: Self-pay | Admitting: Internal Medicine

## 2023-05-10 ENCOUNTER — Encounter: Payer: Self-pay | Admitting: Internal Medicine

## 2023-05-10 NOTE — Telephone Encounter (Unsigned)
 Copied from CRM [id number]. Topic: General - Other >> May 10, 2023 10:49 AM Mark Wilson wrote: Reason for CRM: Patient is needing a dr notes for work from [employer] stating patient is off and wife stated she will be by to pick it up today

## 2023-05-13 ENCOUNTER — Other Ambulatory Visit: Payer: Self-pay | Admitting: Internal Medicine

## 2023-05-13 ENCOUNTER — Other Ambulatory Visit: Payer: Self-pay

## 2023-06-28 ENCOUNTER — Other Ambulatory Visit: Payer: Self-pay | Admitting: Internal Medicine

## 2023-07-09 ENCOUNTER — Other Ambulatory Visit: Payer: Self-pay | Admitting: Internal Medicine

## 2023-07-15 ENCOUNTER — Other Ambulatory Visit (INDEPENDENT_AMBULATORY_CARE_PROVIDER_SITE_OTHER): Payer: No Typology Code available for payment source

## 2023-07-15 DIAGNOSIS — R7302 Impaired glucose tolerance (oral): Secondary | ICD-10-CM

## 2023-07-15 DIAGNOSIS — E7800 Pure hypercholesterolemia, unspecified: Secondary | ICD-10-CM

## 2023-07-15 DIAGNOSIS — E559 Vitamin D deficiency, unspecified: Secondary | ICD-10-CM

## 2023-07-15 LAB — BASIC METABOLIC PANEL WITH GFR
BUN: 13 mg/dL (ref 6–23)
CO2: 26 meq/L (ref 19–32)
Calcium: 8.9 mg/dL (ref 8.4–10.5)
Chloride: 104 meq/L (ref 96–112)
Creatinine, Ser: 1.04 mg/dL (ref 0.40–1.50)
GFR: 71.32 mL/min (ref >60.00)
Glucose, Bld: 148 mg/dL — ABNORMAL HIGH (ref 70–99)
Potassium: 4.3 meq/L (ref 3.5–5.1)
Sodium: 138 meq/L (ref 135–145)

## 2023-07-15 LAB — HEPATIC FUNCTION PANEL
ALT: 31 U/L (ref 0–53)
AST: 24 U/L (ref 0–37)
Albumin: 4.2 g/dL (ref 3.5–5.2)
Alkaline Phosphatase: 64 U/L (ref 39–117)
Bilirubin, Direct: 0.2 mg/dL (ref 0.0–0.3)
Total Bilirubin: 0.9 mg/dL (ref 0.2–1.2)
Total Protein: 6.5 g/dL (ref 6.0–8.3)

## 2023-07-15 LAB — LIPID PANEL
Cholesterol: 109 mg/dL (ref 0–200)
HDL: 32.7 mg/dL — ABNORMAL LOW (ref >39.00)
LDL Cholesterol: 50 mg/dL (ref 0–99)
NonHDL: 76.17
Total CHOL/HDL Ratio: 3
Triglycerides: 133 mg/dL (ref 0.0–149.0)
VLDL: 26.6 mg/dL (ref 0.0–40.0)

## 2023-07-15 LAB — HEMOGLOBIN A1C: Hgb A1c MFr Bld: 6.2 % (ref 4.6–6.5)

## 2023-07-15 LAB — VITAMIN D 25 HYDROXY (VIT D DEFICIENCY, FRACTURES): VITD: 55.54 ng/mL (ref 30.00–100.00)

## 2023-07-22 ENCOUNTER — Encounter: Payer: Self-pay | Admitting: Internal Medicine

## 2023-07-22 ENCOUNTER — Ambulatory Visit (INDEPENDENT_AMBULATORY_CARE_PROVIDER_SITE_OTHER): Payer: No Typology Code available for payment source | Admitting: Internal Medicine

## 2023-07-22 VITALS — BP 120/82 | HR 69 | Temp 97.6°F | Ht 73.0 in | Wt 218.0 lb

## 2023-07-22 DIAGNOSIS — L989 Disorder of the skin and subcutaneous tissue, unspecified: Secondary | ICD-10-CM | POA: Diagnosis not present

## 2023-07-22 DIAGNOSIS — Z720 Tobacco use: Secondary | ICD-10-CM | POA: Diagnosis not present

## 2023-07-22 DIAGNOSIS — E538 Deficiency of other specified B group vitamins: Secondary | ICD-10-CM

## 2023-07-22 DIAGNOSIS — E559 Vitamin D deficiency, unspecified: Secondary | ICD-10-CM | POA: Diagnosis not present

## 2023-07-22 DIAGNOSIS — Z125 Encounter for screening for malignant neoplasm of prostate: Secondary | ICD-10-CM

## 2023-07-22 DIAGNOSIS — R7302 Impaired glucose tolerance (oral): Secondary | ICD-10-CM

## 2023-07-22 DIAGNOSIS — I10 Essential (primary) hypertension: Secondary | ICD-10-CM | POA: Diagnosis not present

## 2023-07-22 NOTE — Assessment & Plan Note (Signed)
 Last vitamin D  Lab Results  Component Value Date   VD25OH 55.54 07/15/2023   Stable, cont oral replacement

## 2023-07-22 NOTE — Patient Instructions (Signed)
 Please continue all other medications as before, and refills have been done if requested.  Please have the pharmacy call with any other refills you may need.  Please continue your efforts at being more active, low cholesterol diet, and weight control.  You are otherwise up to date with prevention measures today.  Please keep your appointments with your specialists as you may have planned  You will be contacted regarding the referral for: Dermatology  Please make an Appointment to return in 6 months, or sooner if needed, also with Lab Appointment for testing done 3-5 days before at the FIRST FLOOR Lab (so this is for TWO appointments - please see the scheduling desk as you leave)

## 2023-07-22 NOTE — Assessment & Plan Note (Signed)
 Lab Results  Component Value Date   HGBA1C 6.2 07/15/2023   Stable, pt to continue current medical treatment  - diet, wt control

## 2023-07-22 NOTE — Assessment & Plan Note (Signed)
 Worsening enlarging, for dermatology referral

## 2023-07-22 NOTE — Assessment & Plan Note (Signed)
 BP Readings from Last 3 Encounters:  07/22/23 120/82  05/06/23 (!) 154/88  05/03/23 130/88   Stable, pt to continue medical treatment coreg  6.25 bid,

## 2023-07-22 NOTE — Assessment & Plan Note (Signed)
 Pt counseled to quit, pt not ready

## 2023-07-22 NOTE — Progress Notes (Signed)
 Patient ID: Mark Wilson, male   DOB: 1950/03/25, 73 y.o.   MRN: [id number]        Chief Complaint: follow up htn, hyperglycemia, skin lesion chest wall, smoker, low vit d       HPI:  Mark Wilson is a 73 y.o. male here overall doing ok; Pt denies chest pain, increased sob or doe, wheezing, orthopnea, PND, increased LE swelling, palpitations, dizziness or syncope.   Pt denies polydipsia, polyuria, or new focal neuro s/s.    Pt denies fever, wt loss, night sweats, loss of appetite, or other constitutional symptoms  Does has persistent enlarging wart like lesion to right lower chest wall.          Wt Readings from Last 3 Encounters:  07/22/23 218 lb (98.9 kg)  05/06/23 216 lb 6 oz (98.1 kg)  05/03/23 217 lb (98.4 kg)   BP Readings from Last 3 Encounters:  07/22/23 120/82  05/06/23 (!) 154/88  05/03/23 130/88         Past Medical History:  Diagnosis Date   ALLERGIC RHINITIS 02/12/2008   Allergy    Arthritis    ASTHMA UNSPECIFIED WITH EXACERBATION 02/12/2008   HYPERLIPIDEMIA 02/12/2008   Impaired glucose tolerance 12/28/2010   Past Surgical History:  Procedure Laterality Date   TONSILLECTOMY      reports that he has been smoking cigars. He has quit using smokeless tobacco. He reports current alcohol use of about 8.0 standard drinks of alcohol per week. He reports that he does not use drugs. family history includes Breast cancer in his maternal grandmother; Depression in his mother; Diabetes in his maternal grandmother; Hypertension in his mother. Allergies  Allergen Reactions   Penicillins Shortness Of Breath   Current Outpatient Medications on File Prior to Visit  Medication Sig Dispense Refill   albuterol  (VENTOLIN  HFA) 108 (90 Base) MCG/ACT inhaler INHALE 2 PUFFS INTO THE LUNGS EVERY 6 HOURS AS NEEDED FOR WHEEZING OR SHORTNESS OF BREATH 8.5 g 5   aspirin 81 MG tablet Take 81 mg by mouth daily.       atorvastatin  (LIPITOR) 80 MG tablet Take 1 tablet (80 mg total) by mouth daily. 90  tablet 3   carvedilol  (COREG ) 6.25 MG tablet TAKE 1 TABLET BY MOUTH TWICE DAILY WITH MEAL. 180 tablet 3   ciclopirox  (PENLAC ) 8 % solution Apply topically at bedtime. Apply over nail and surrounding skin. Apply daily over previous coat. After seven (7) days, may remove with alcohol and continue cycle. 6.6 mL 11   cyclobenzaprine  (FLEXERIL ) 5 MG tablet Take 1 tablet (5 mg total) by mouth 3 (three) times daily as needed for muscle spasms. 40 tablet 1   fish oil-omega-3 fatty acids 1000 MG capsule Take 1,200 mg by mouth daily.     methylPREDNISolone  (MEDROL  DOSEPAK) 4 MG TBPK tablet As directed 21 tablet 0   Multiple Vitamins-Minerals (CENTRUM SILVER PO) Take 1 tablet by mouth daily.     naproxen  (NAPROSYN ) 500 MG tablet TAKE 1 TABLET(500 MG) BY MOUTH TWICE DAILY AS NEEDED 60 tablet 5   omeprazole  (PRILOSEC) 20 MG capsule TAKE 1 CAPSULE(20 MG) BY MOUTH TWICE DAILY BEFORE A MEAL 180 capsule 3   promethazine -dextromethorphan (PROMETHAZINE -DM) 6.25-15 MG/5ML syrup Take 5 mLs by mouth 4 (four) times daily as needed. 118 mL 0   traMADol  (ULTRAM ) 50 MG tablet Take 1 tablet (50 mg total) by mouth every 8 (eight) hours as needed. 120 tablet 1   No current facility-administered medications on file prior to  visit.        ROS:  All others reviewed and negative.  Objective        PE:  BP 120/82   Pulse 69   Temp 97.6 F (36.4 C)   Ht 6' 1 (1.854 m)   Wt 218 lb (98.9 kg)   SpO2 96%   BMI 28.76 kg/m                 Constitutional: Pt appears in NAD               HENT: Head: NCAT.                Right Ear: External ear normal.                 Left Ear: External ear normal.                Eyes: . Pupils are equal, round, and reactive to light. Conjunctivae and EOM are normal               Nose: without d/c or deformity               Neck: Neck supple. Gross normal ROM               Cardiovascular: Normal rate and regular rhythm.                 Pulmonary/Chest: Effort normal and breath sounds  without rales or wheezing.                Abd:  Soft, NT, ND, + BS, no organomegaly               Neurological: Pt is alert. At baseline orientation, motor grossly intact               Skin: Skin is warm. No rashes, no other new lesions, LE edema - none               Psychiatric: Pt behavior is normal without agitation   Micro: none  Cardiac tracings I have personally interpreted today:  none  Pertinent Radiological findings (summarize): none   Lab Results  Component Value Date   WBC 5.4 01/07/2023   HGB 15.5 01/07/2023   HCT 45.8 01/07/2023   PLT 160.0 01/07/2023   GLUCOSE 148 (H) 07/15/2023   CHOL 109 07/15/2023   TRIG 133.0 07/15/2023   HDL 32.70 (L) 07/15/2023   LDLDIRECT 86.0 04/21/2019   LDLCALC 50 07/15/2023   ALT 31 07/15/2023   AST 24 07/15/2023   NA 138 07/15/2023   K 4.3 07/15/2023   CL 104 07/15/2023   CREATININE 1.04 07/15/2023   BUN 13 07/15/2023   CO2 26 07/15/2023   TSH 1.40 01/07/2023   PSA 0.78 01/07/2023   HGBA1C 6.2 07/15/2023   Assessment/Plan:  Mark Wilson is a 73 y.o. White or Caucasian [1] male with  has a past medical history of ALLERGIC RHINITIS (02/12/2008), Allergy, Arthritis, ASTHMA UNSPECIFIED WITH EXACERBATION (02/12/2008), HYPERLIPIDEMIA (02/12/2008), and Impaired glucose tolerance (12/28/2010).  HTN (hypertension) BP Readings from Last 3 Encounters:  07/22/23 120/82  05/06/23 (!) 154/88  05/03/23 130/88   Stable, pt to continue medical treatment coreg  6.25 bid,    Impaired glucose tolerance Lab Results  Component Value Date   HGBA1C 6.2 07/15/2023   Stable, pt to continue current medical treatment  - diet, wt control   Skin lesion of chest wall Worsening enlarging, for  dermatology referral  Tobacco abuse Pt counseled to quit, pt not ready  Vitamin D  deficiency Last vitamin D  Lab Results  Component Value Date   VD25OH 55.54 07/15/2023   Stable, cont oral replacement  Followup: Return in about 6 months (around  01/22/2024).  Mark Rush, MD 07/22/2023 8:34 PM [facility]

## 2023-07-28 IMAGING — CT CT ANGIO CHEST
2 of 7 series · 17 of 36 positions shown · IV contrast (agent unspecified)
Comparison: Coronary calcium score cardiac CT 07/28/2020

CLINICAL DATA: Pulmonary nodule.  Aortic aneurysm.

EXAM:
CT ANGIOGRAPHY CHEST WITH CONTRAST
TECHNIQUE: Multidetector CT imaging of the chest was performed using the
standard protocol during bolus administration of intravenous
contrast. Multiplanar CT image reconstructions and MIPs were
obtained to evaluate the vascular anatomy.

[Series 8: cta thorax 1.00 bv36 s3 arterial thins · axial · arterial · 0.84mm/px · z∈[+1516,+1812]mm · 16 of 558 slices shown]
[im 33/558  lung]
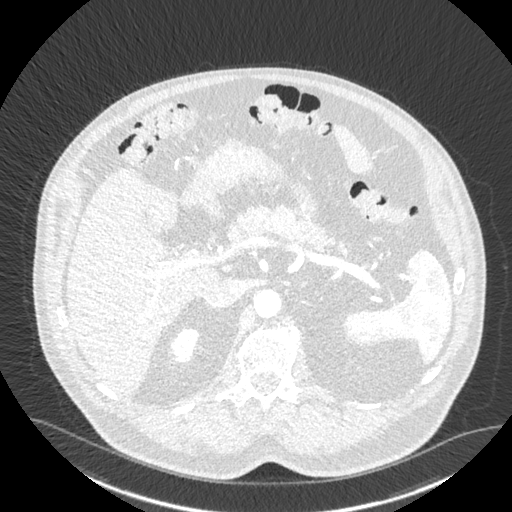
[im 66/558  mediastinal]
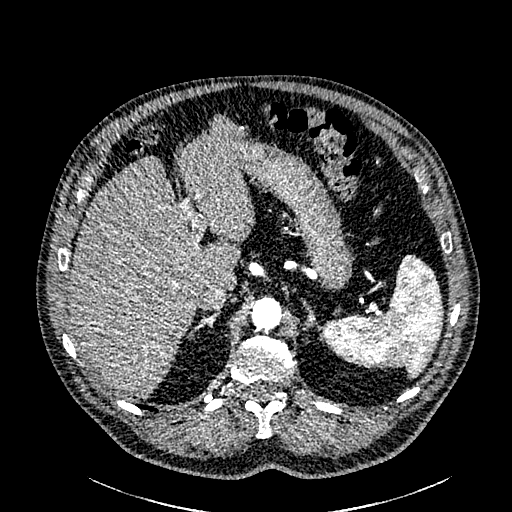
[im 99/558  lung]
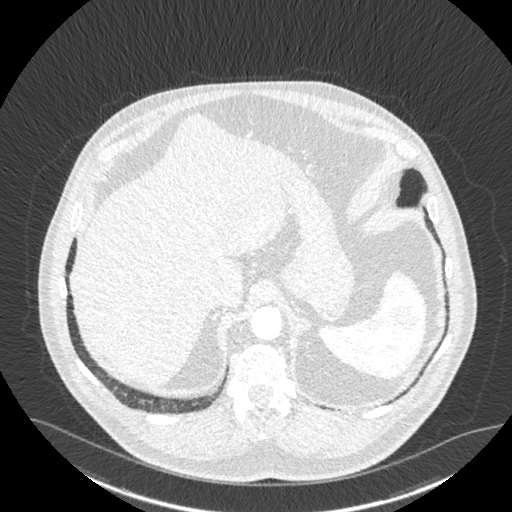
[im 132/558  mediastinal]
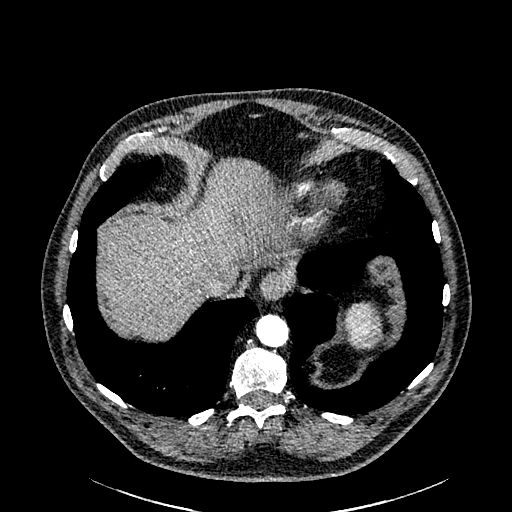
[im 164/558  lung]
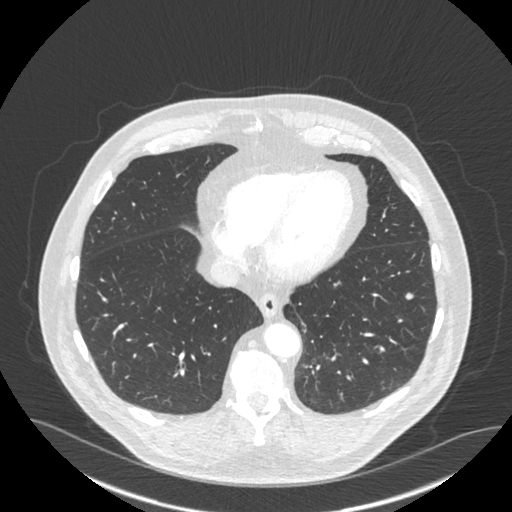
[im 197/558  mediastinal]
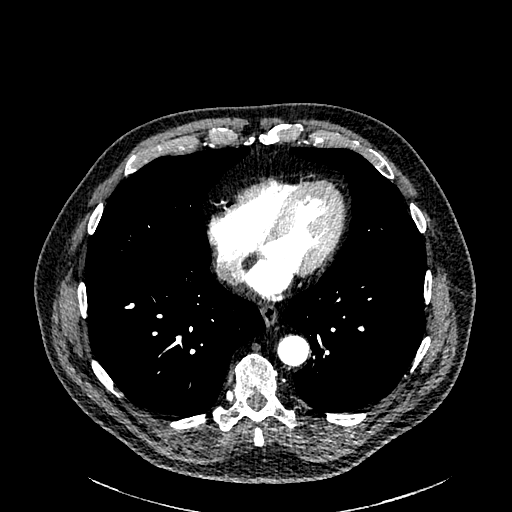
[im 230/558  lung]
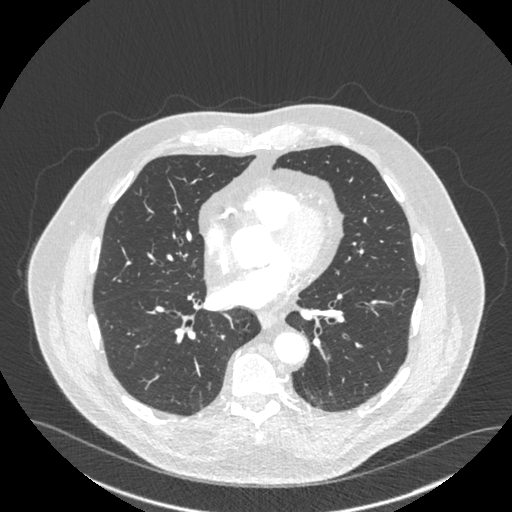
[im 263/558  mediastinal]
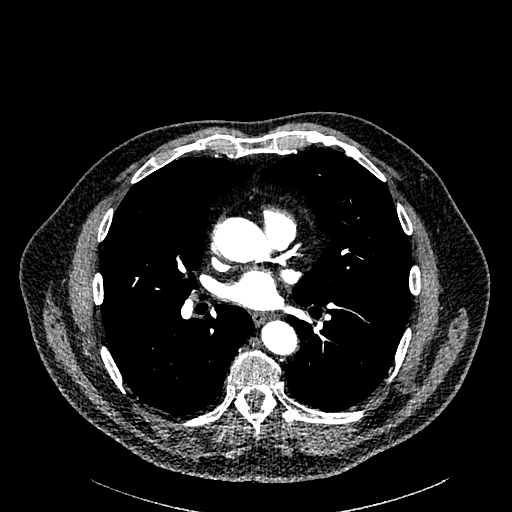
[im 295/558  lung]
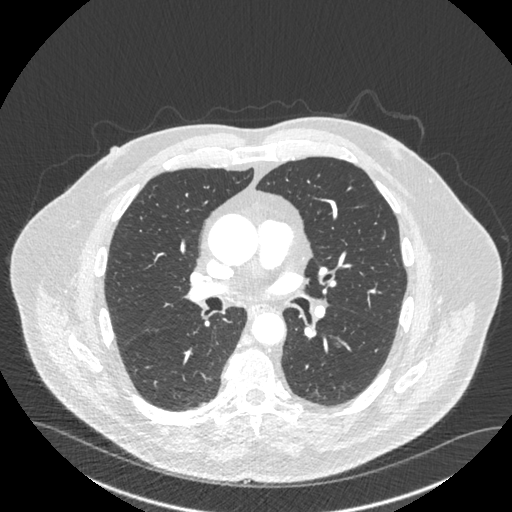
[im 328/558  mediastinal]
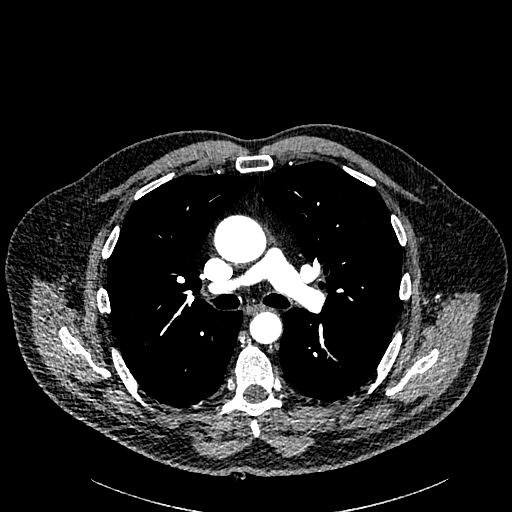
[im 361/558  lung]
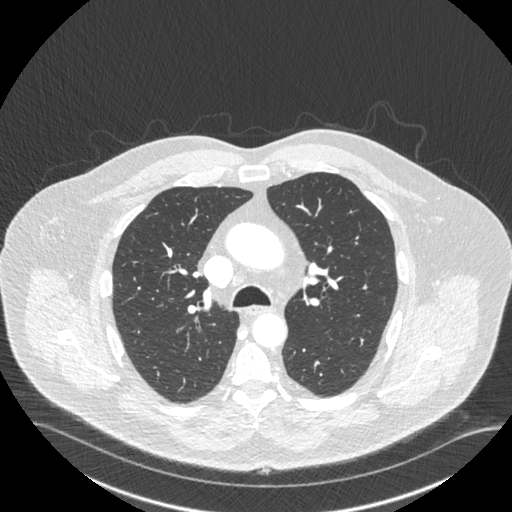
[im 394/558  mediastinal]
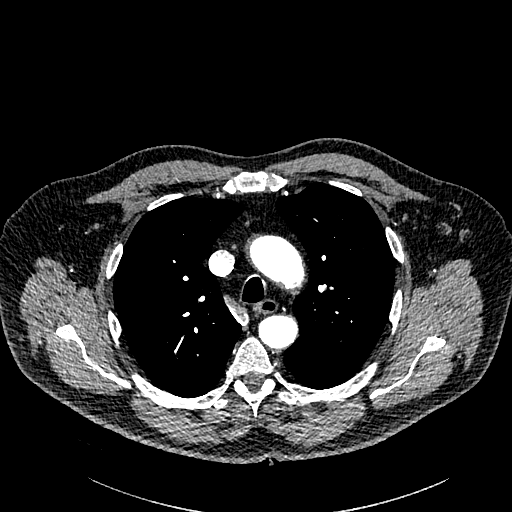
[im 426/558  lung]
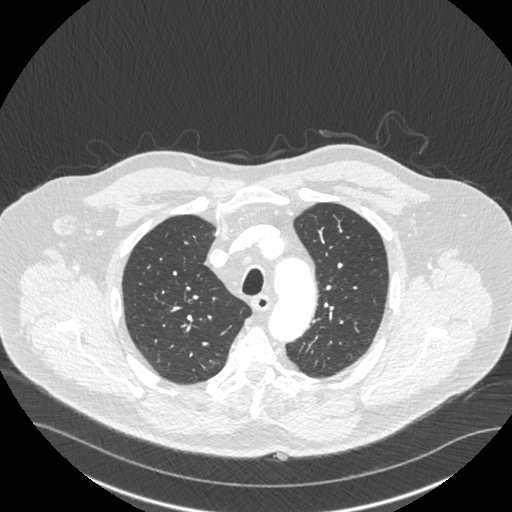
[im 459/558  mediastinal]
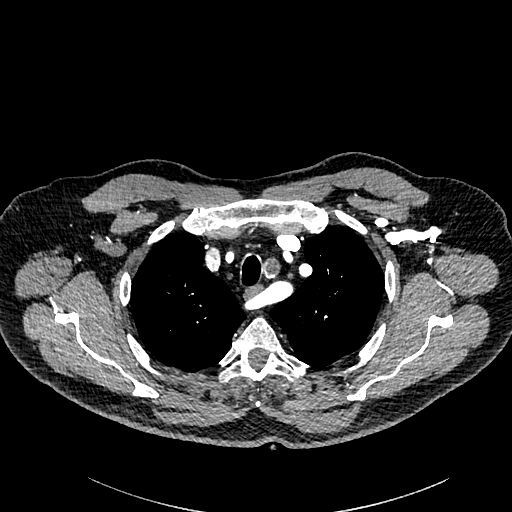
[im 492/558  lung]
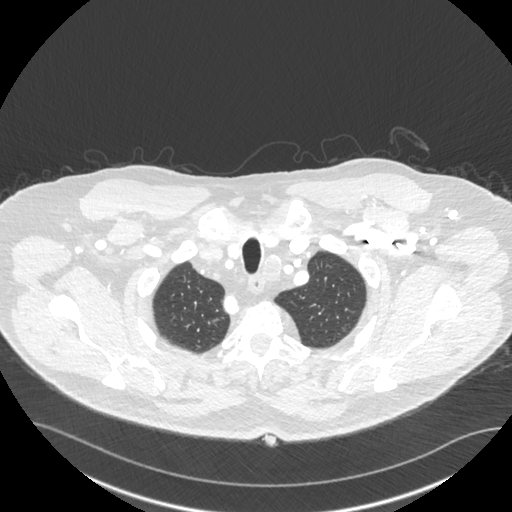
[im 525/558  mediastinal]
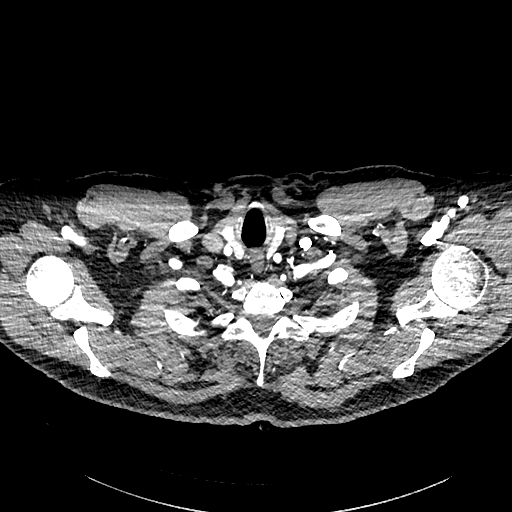

[Series 9: cta thorax 2.00 bv36 s3 cor st · coronal · 0.66mm/px · 1 of 184 slices shown]
[im 92/184  mediastinal]
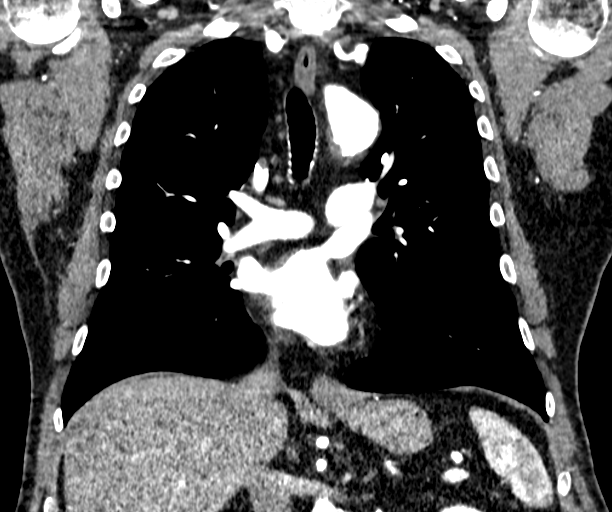

[17 of 36 positions shown; findings below may reference images not displayed]

RADIATION DOSE REDUCTION: This exam was performed according to the
departmental dose-optimization program which includes automated
exposure control, adjustment of the mA and/or kV according to
patient size and/or use of iterative reconstruction technique.

CONTRAST:  75mL 0SC47L-8MP IOPAMIDOL (0SC47L-8MP) INJECTION 76%

Creatinine was obtained on site at [HOSPITAL] at [REDACTED].

Results: Creatinine 1.1 mg/dL.
FINDINGS: Cardiovascular: Ascending thoracic aortic aneurysm 4.0 cm in
diameter on image 80 series 4. No aortic dissection or acute
thoracic aortic findings.

Aberrant right subclavian artery passes behind the esophagus.

Coronary, aortic arch, and branch vessel atherosclerotic vascular
disease.

Mediastinum/Nodes: Partially calcified left upper paratracheal
lesion 2.6 by 1.6 by 2.7 cm. This could represent a septated cystic
lesion given the pattern of calcification along a transverse portion
of this lesion. This is not directly tangential to the thyroid gland
although is near the thyroid gland. This is more lateral than I
would expect for a thymoma.

Lungs/Pleura: Airway thickening is present, suggesting bronchitis or
reactive airways disease.

Right lower lobe subpleural nodule is 3 mm in thickness on image 79
series [DATE] by 4 mm right subpleural nodule on image 75 series [DATE] by 4 mm lingular nodule on image 96 series [DATE] by 6 by 5 mm (volume = 100 mm^3) left lower lobe pulmonary
nodule on image 118 series 6.

Upper Abdomen: Unremarkable

Musculoskeletal: Unremarkable

Review of the MIP images confirms the above findings.
IMPRESSION: 1. Stable 100 cubic mm left lower lobe pulmonary nodule on image 118
series 6. Smaller right lower lobe pulmonary nodules are present. We
have currently established 9 months of stability. According to
current guidelines, a follow up chest CT is optional for low risk
patients but recommended for high risk patients in 1 years time.
This recommendation follows the consensus statement: Guidelines for
Management of Incidental Pulmonary Nodules Detected on CT Images:
2. Ascending aortic aneurysm 4.0 cm in diameter. Recommend annual
imaging followup by CTA or MRA. This recommendation follows 2898
ACCF/AHA/AATS/ACR/ASA/SCA/BATUMI/SANTES/GIORGI/VIDAL Guidelines for the
Diagnosis and Management of Patients with Thoracic Aortic Disease.
Circulation. 2898; 121: E266-e369. Aortic aneurysm NOS (JL66N-LLI.4)
3. Airway thickening is present, suggesting bronchitis or reactive
airways disease.
4.  Aortic Atherosclerosis (JL66N-27X.X).  Coronary atherosclerosis.
5. Aberrant right subclavian artery passes behind the esophagus.

## 2023-10-22 ENCOUNTER — Ambulatory Visit (INDEPENDENT_AMBULATORY_CARE_PROVIDER_SITE_OTHER)

## 2023-10-22 DIAGNOSIS — Z23 Encounter for immunization: Secondary | ICD-10-CM

## 2023-10-22 NOTE — Progress Notes (Signed)
 Pt was given HD flu vaccine w/o any complications at this time.

## 2024-01-16 ENCOUNTER — Other Ambulatory Visit: Payer: Self-pay

## 2024-01-16 ENCOUNTER — Other Ambulatory Visit: Payer: Self-pay | Admitting: Internal Medicine

## 2024-01-16 ENCOUNTER — Telehealth: Payer: Self-pay

## 2024-01-16 MED ORDER — ATORVASTATIN CALCIUM 80 MG PO TABS: 80.0000 mg | ORAL_TABLET | Freq: Every day | ORAL | 3 refills | Status: DC

## 2024-01-16 NOTE — Telephone Encounter (Signed)
 Copied from CRM #[id number]. Topic: Clinical - Medication Refill >> Jan 16, 2024 11:12 AM Alfonso ORN wrote: Medication: Reason for CRM: atorvastatin  (LIPITOR)   Has the patient contacted their pharmacy? Yes  This is the patient's preferred pharmacy:  [id number] - [place], [place] - [address] AT [facility] OF [address] [place] [address] Phone: [phone] Fax: [phone]  Is this the correct pharmacy for this prescription? Yes If no, delete pharmacy and type the correct one.   Has the prescription been filled recently? no  Is the patient out of the medication? Yes  Has the patient been seen for an appointment in the last year OR does the patient have an upcoming appointment? Yes  Can we respond through MyChart?Yes

## 2024-01-16 NOTE — Telephone Encounter (Signed)
 Refill has been sent.

## 2024-01-16 NOTE — Telephone Encounter (Signed)
 Copied from CRM #[id number]. Topic: Clinical - Prescription Issue >> Jan 16, 2024 11:11 AM Alfonso ORN wrote: Reason for CRM: Reason for CRM: pt called to f/u on rx request for atorvastatin  (LIPITOR) 80 MG tablet  originally requested 1/11 by pharmacy. Advised no request was submitted by pharmacy. Refill request submitted pt has been out of medication and asking for rush or rx.

## 2024-01-17 ENCOUNTER — Other Ambulatory Visit

## 2024-01-17 DIAGNOSIS — Z125 Encounter for screening for malignant neoplasm of prostate: Secondary | ICD-10-CM

## 2024-01-17 DIAGNOSIS — R7302 Impaired glucose tolerance (oral): Secondary | ICD-10-CM | POA: Diagnosis not present

## 2024-01-17 DIAGNOSIS — I10 Essential (primary) hypertension: Secondary | ICD-10-CM | POA: Diagnosis not present

## 2024-01-17 DIAGNOSIS — E538 Deficiency of other specified B group vitamins: Secondary | ICD-10-CM

## 2024-01-17 DIAGNOSIS — E559 Vitamin D deficiency, unspecified: Secondary | ICD-10-CM

## 2024-01-17 LAB — BASIC METABOLIC PANEL WITH GFR
BUN: 18 mg/dL (ref 6–23)
CO2: 29 meq/L (ref 19–32)
Calcium: 9.6 mg/dL (ref 8.4–10.5)
Chloride: 103 meq/L (ref 96–112)
Creatinine, Ser: 1.11 mg/dL (ref 0.40–1.50)
GFR: 65.73 mL/min
Glucose, Bld: 136 mg/dL — ABNORMAL HIGH (ref 70–99)
Potassium: 4.5 meq/L (ref 3.5–5.1)
Sodium: 140 meq/L (ref 135–145)

## 2024-01-17 LAB — HEPATIC FUNCTION PANEL
ALT: 39 U/L (ref 3–53)
AST: 31 U/L (ref 5–37)
Albumin: 4.5 g/dL (ref 3.5–5.2)
Alkaline Phosphatase: 67 U/L (ref 39–117)
Bilirubin, Direct: 0.3 mg/dL (ref 0.1–0.3)
Total Bilirubin: 1.3 mg/dL — ABNORMAL HIGH (ref 0.2–1.2)
Total Protein: 6.8 g/dL (ref 6.0–8.3)

## 2024-01-17 LAB — CBC WITH DIFFERENTIAL/PLATELET
Basophils Absolute: 0.1 K/uL (ref 0.0–0.1)
Basophils Relative: 0.9 % (ref 0.0–3.0)
Eosinophils Absolute: 0.4 K/uL (ref 0.0–0.7)
Eosinophils Relative: 6.5 % — ABNORMAL HIGH (ref 0.0–5.0)
HCT: 43.1 % (ref 39.0–52.0)
Hemoglobin: 14.9 g/dL (ref 13.0–17.0)
Lymphocytes Relative: 23.4 % (ref 12.0–46.0)
Lymphs Abs: 1.3 K/uL (ref 0.7–4.0)
MCHC: 34.6 g/dL (ref 30.0–36.0)
MCV: 92.4 fl (ref 78.0–100.0)
Monocytes Absolute: 0.5 K/uL (ref 0.1–1.0)
Monocytes Relative: 9.6 % (ref 3.0–12.0)
Neutro Abs: 3.3 K/uL (ref 1.4–7.7)
Neutrophils Relative %: 59.6 % (ref 43.0–77.0)
Platelets: 164 K/uL (ref 150.0–400.0)
RBC: 4.66 Mil/uL (ref 4.22–5.81)
RDW: 13 % (ref 11.5–15.5)
WBC: 5.5 K/uL (ref 4.0–10.5)

## 2024-01-17 LAB — PSA: PSA: 0.86 ng/mL (ref 0.10–4.00)

## 2024-01-17 LAB — URINALYSIS, ROUTINE W REFLEX MICROSCOPIC
Bilirubin Urine: NEGATIVE
Hgb urine dipstick: NEGATIVE
Ketones, ur: NEGATIVE
Leukocytes,Ua: NEGATIVE
Nitrite: NEGATIVE
Specific Gravity, Urine: 1.015 (ref 1.000–1.030)
Total Protein, Urine: NEGATIVE
Urine Glucose: NEGATIVE
Urobilinogen, UA: 0.2 (ref 0.0–1.0)
pH: 6 (ref 5.0–8.0)

## 2024-01-17 LAB — VITAMIN B12: Vitamin B-12: 762 pg/mL (ref 211–911)

## 2024-01-17 LAB — LIPID PANEL
Cholesterol: 98 mg/dL (ref 28–200)
HDL: 27.5 mg/dL — ABNORMAL LOW
LDL Cholesterol: 45 mg/dL (ref 10–99)
NonHDL: 70.16
Total CHOL/HDL Ratio: 4
Triglycerides: 125 mg/dL (ref 10.0–149.0)
VLDL: 25 mg/dL (ref 0.0–40.0)

## 2024-01-17 LAB — VITAMIN D 25 HYDROXY (VIT D DEFICIENCY, FRACTURES): VITD: 60.4 ng/mL (ref 30.00–100.00)

## 2024-01-17 LAB — HEMOGLOBIN A1C: Hgb A1c MFr Bld: 5.9 % (ref 4.6–6.5)

## 2024-01-17 LAB — TSH: TSH: 1.62 u[IU]/mL (ref 0.35–5.50)

## 2024-01-22 ENCOUNTER — Ambulatory Visit (INDEPENDENT_AMBULATORY_CARE_PROVIDER_SITE_OTHER): Admitting: Internal Medicine

## 2024-01-22 ENCOUNTER — Encounter: Payer: Self-pay | Admitting: Internal Medicine

## 2024-01-22 VITALS — BP 124/78 | HR 73 | Temp 98.3°F | Ht 73.0 in | Wt 220.0 lb

## 2024-01-22 DIAGNOSIS — I2583 Coronary atherosclerosis due to lipid rich plaque: Secondary | ICD-10-CM | POA: Diagnosis not present

## 2024-01-22 DIAGNOSIS — I77810 Thoracic aortic ectasia: Secondary | ICD-10-CM

## 2024-01-22 DIAGNOSIS — I10 Essential (primary) hypertension: Secondary | ICD-10-CM | POA: Diagnosis not present

## 2024-01-22 DIAGNOSIS — L609 Nail disorder, unspecified: Secondary | ICD-10-CM | POA: Diagnosis not present

## 2024-01-22 DIAGNOSIS — E7800 Pure hypercholesterolemia, unspecified: Secondary | ICD-10-CM

## 2024-01-22 DIAGNOSIS — Z0000 Encounter for general adult medical examination without abnormal findings: Secondary | ICD-10-CM

## 2024-01-22 DIAGNOSIS — Z0001 Encounter for general adult medical examination with abnormal findings: Secondary | ICD-10-CM

## 2024-01-22 DIAGNOSIS — R7302 Impaired glucose tolerance (oral): Secondary | ICD-10-CM

## 2024-01-22 DIAGNOSIS — I2510 Atherosclerotic heart disease of native coronary artery without angina pectoris: Secondary | ICD-10-CM | POA: Insufficient documentation

## 2024-01-22 DIAGNOSIS — E538 Deficiency of other specified B group vitamins: Secondary | ICD-10-CM

## 2024-01-22 DIAGNOSIS — D485 Neoplasm of uncertain behavior of skin: Secondary | ICD-10-CM | POA: Insufficient documentation

## 2024-01-22 MED ORDER — CARVEDILOL 6.25 MG PO TABS: 6.2500 mg | ORAL_TABLET | Freq: Two times a day (BID) | ORAL | 3 refills | Status: AC

## 2024-01-22 MED ORDER — ATORVASTATIN CALCIUM 80 MG PO TABS: 80.0000 mg | ORAL_TABLET | Freq: Every day | ORAL | 3 refills | Status: AC

## 2024-01-22 MED ORDER — OMEPRAZOLE 20 MG PO CPDR: 20.0000 mg | DELAYED_RELEASE_CAPSULE | Freq: Two times a day (BID) | ORAL | 3 refills | Status: AC

## 2024-01-22 NOTE — Patient Instructions (Signed)
 Please continue all other medications as before, and refills have been done if requested.  Please have the pharmacy call with any other refills you may need.  Please continue your efforts at being more active, low cholesterol diet, and weight control.  You are otherwise up to date with prevention measures today.  Please keep your appointments with your specialists as you may have planned  You will be contacted regarding the referral for: Podiatry, Cardiothoracic surgury, and Cardiology  Please make an Appointment to return in 6 months, or sooner if needed

## 2024-01-22 NOTE — Progress Notes (Unsigned)
 "       Chief Complaint:: wellness exam and nail disorder, aortic dilation, CAD, htn, hld, hyperglycemia       HPI:  Mark Wilson is a 74 y.o. male here for wellness exam; up to date                        Also has mar 2026 derm appt for chest skin lesion.  Pt denies chest pain, increased sob or doe, wheezing, orthopnea, PND, increased LE swelling, palpitations, dizziness or syncope.   Pt denies polydipsia, polyuria, or new focal neuro s/s.    Pt denies fever, wt loss, night sweats, loss of appetite, or other constitutional symptoms  Has worsening uncontrolled toe nails multiple.  Due for f/u aortic dilation f/u - will need CV surg referral.  Also previous cardiology has retired - has CAD with need for f/u soon.    Wt Readings from Last 3 Encounters:  01/22/24 220 lb (99.8 kg)  07/22/23 218 lb (98.9 kg)  05/06/23 216 lb 6 oz (98.1 kg)   BP Readings from Last 3 Encounters:  01/22/24 124/78  07/22/23 120/82  05/06/23 (!) 154/88   Immunization History  Administered Date(s) Administered   Fluad Quad(high Dose 65+) 09/20/2018, 10/09/2019, 09/30/2020, 10/04/2021   Fluad Trivalent(High Dose 65+) 10/04/2022   H1N1 02/12/2008   INFLUENZA, HIGH DOSE SEASONAL PF 09/25/2016, 09/17/2017, 10/22/2023   Influenza Split 09/25/2010, 11/05/2011   Influenza Whole 10/02/2007, 11/21/2009   Influenza,inj,Quad PF,6+ Mos 10/24/2013, 10/22/2014   PFIZER Comirnaty(Gray Top)Covid-19 Tri-Sucrose Vaccine 05/21/2020   PFIZER(Purple Top)SARS-COV-2 Vaccination 03/26/2019, 04/20/2019, 10/17/2019, 10/01/2020   Pfizer Covid-19 Vaccine Bivalent Booster 67yrs & up 10/01/2020   Pneumococcal Conjugate-13 03/11/2018   Pneumococcal Polysaccharide-23 11/01/2015   Td 01/02/2004   Tdap 03/14/2016   Zoster Recombinant(Shingrix) 10/16/2020, 12/22/2020   Zoster, Live 09/25/2010  There are no preventive care reminders to display for this patient.    Past Medical History:  Diagnosis Date   ALLERGIC RHINITIS 02/12/2008   Allergy     Arthritis    ASTHMA UNSPECIFIED WITH EXACERBATION 02/12/2008   HYPERLIPIDEMIA 02/12/2008   Impaired glucose tolerance 12/28/2010   Past Surgical History:  Procedure Laterality Date   TONSILLECTOMY      reports that he has been smoking cigars. He has quit using smokeless tobacco. He reports current alcohol use of about 8.0 standard drinks of alcohol per week. He reports that he does not use drugs. family history includes Breast cancer in his maternal grandmother; Depression in his mother; Diabetes in his maternal grandmother; Hypertension in his mother. Allergies[1] Medications Ordered Prior to Encounter[2]      ROS:  All others reviewed and negative.  Objective        PE:  BP 124/78 (BP Location: Right Arm, Patient Position: Sitting, Cuff Size: Normal)   Pulse 73   Temp 98.3 F (36.8 C) (Oral)   Ht 6' 1 (1.854 m)   Wt 220 lb (99.8 kg)   SpO2 98%   BMI 29.03 kg/m                 Constitutional: Pt appears in NAD               HENT: Head: NCAT.                Right Ear: External ear normal.                 Left Ear: External ear normal.  Eyes: . Pupils are equal, round, and reactive to light. Conjunctivae and EOM are normal               Nose: without d/c or deformity               Neck: Neck supple. Gross normal ROM               Cardiovascular: Normal rate and regular rhythm.                 Pulmonary/Chest: Effort normal and breath sounds without rales or wheezing.                Abd:  Soft, NT, ND, + BS, no organomegaly               Neurological: Pt is alert. At baseline orientation, motor grossly intact               Skin: Skin is warm. No rashes, no other new lesions, LE edema - none               Psychiatric: Pt behavior is normal without agitation   Micro: none  Cardiac tracings I have personally interpreted today:  none  Pertinent Radiological findings (summarize): none   Lab Results  Component Value Date   WBC 5.5 01/17/2024   HGB 14.9  01/17/2024   HCT 43.1 01/17/2024   PLT 164.0 01/17/2024   GLUCOSE 136 (H) 01/17/2024   CHOL 98 01/17/2024   TRIG 125.0 01/17/2024   HDL 27.50 (L) 01/17/2024   LDLDIRECT 86.0 04/21/2019   LDLCALC 45 01/17/2024   ALT 39 01/17/2024   AST 31 01/17/2024   NA 140 01/17/2024   K 4.5 01/17/2024   CL 103 01/17/2024   CREATININE 1.11 01/17/2024   BUN 18 01/17/2024   CO2 29 01/17/2024   TSH 1.62 01/17/2024   PSA 0.86 01/17/2024   HGBA1C 5.9 01/17/2024   Assessment/Plan:  Mark Wilson is a 74 y.o. White or Caucasian [1] male with  has a past medical history of ALLERGIC RHINITIS (02/12/2008), Allergy, Arthritis, ASTHMA UNSPECIFIED WITH EXACERBATION (02/12/2008), HYPERLIPIDEMIA (02/12/2008), and Impaired glucose tolerance (12/28/2010).  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 I have personally reviewed and have noted: 1) the patient's medical and social history 2) The patient's current medications and supplements 3) The patient's height, weight, and BMI have been recorded in the chart   HTN (hypertension) BP Readings from Last 3 Encounters:  01/22/24 124/78  07/22/23 120/82  05/06/23 (!) 154/88   Stable, pt to continue medical treatment coreg  6.25 bid,  \  Hyperlipidemia Lab Results  Component Value Date   LDLCALC 45 01/17/2024   Stable, pt to continue current statin lipitor 80 mg qd   Impaired glucose tolerance Lab Results  Component Value Date   HGBA1C 5.9 01/17/2024   Stable, pt to continue  current medical treatment  - diet, wt control   Nail disorder With multiple toe nail uncontrolled long unable to tend himself - for podiatry referral  Followup: Return in  about 6 months (around 07/21/2024).  Mark Rush, MD 01/23/2024 9:11 PM [facility] [place] [facility] Internal Medicine     [1]  Allergies Allergen Reactions   Penicillins Shortness Of Breath  [2]  Current Outpatient Medications on File Prior to Visit  Medication Sig Dispense Refill   albuterol  (VENTOLIN  HFA) 108 (90 Base) MCG/ACT inhaler INHALE 2 PUFFS INTO THE LUNGS EVERY 6 HOURS AS NEEDED FOR WHEEZING OR SHORTNESS OF BREATH 8.5 g 5   aspirin 81 MG tablet Take 81 mg by mouth daily.       cyclobenzaprine  (FLEXERIL ) 5 MG tablet Take 1 tablet (5 mg total) by mouth 3 (three) times daily as needed for muscle spasms. 40 tablet 1   fish oil-omega-3 fatty acids 1000 MG capsule Take 1,200 mg by mouth daily.     montelukast  (SINGULAIR ) 10 MG tablet 1 tablet in the evening Orally Once a day; Duration: 30 day(s)     Multiple Vitamins-Minerals (CENTRUM SILVER PO) Take 1 tablet by mouth daily.     naproxen  (NAPROSYN ) 500 MG tablet TAKE 1 TABLET(500 MG) BY MOUTH TWICE DAILY AS NEEDED 60 tablet 5   traMADol  (ULTRAM ) 50 MG tablet Take 1 tablet (50 mg total) by mouth every 8 (eight) hours as needed. 120 tablet 1   No current facility-administered medications on file prior to visit.   "

## 2024-01-23 ENCOUNTER — Other Ambulatory Visit: Payer: Self-pay | Admitting: Surgery

## 2024-01-23 ENCOUNTER — Encounter: Payer: Self-pay | Admitting: Internal Medicine

## 2024-01-23 DIAGNOSIS — I7121 Aneurysm of the ascending aorta, without rupture: Secondary | ICD-10-CM

## 2024-01-23 NOTE — Assessment & Plan Note (Signed)
 Will need f/u imaging soon - for CV surgury referral

## 2024-01-23 NOTE — Assessment & Plan Note (Signed)
 Lab Results  Component Value Date   HGBA1C 5.9 01/17/2024   Stable, pt to continue current medical treatment  - diet, wt control

## 2024-01-23 NOTE — Assessment & Plan Note (Signed)
 BP Readings from Last 3 Encounters:  01/22/24 124/78  07/22/23 120/82  05/06/23 (!) 154/88   Stable, pt to continue medical treatment coreg  6.25 bid,  \

## 2024-01-23 NOTE — Assessment & Plan Note (Signed)
 Asympt now, for cardiology f/u as previous has retired

## 2024-01-23 NOTE — Assessment & Plan Note (Signed)
 With multiple toe nail uncontrolled long unable to tend himself - for podiatry referral

## 2024-01-23 NOTE — Assessment & Plan Note (Signed)

## 2024-01-23 NOTE — Assessment & Plan Note (Signed)
 Lab Results  Component Value Date   LDLCALC 45 01/17/2024   Stable, pt to continue current statin lipitor 80 mg qd

## 2024-02-06 ENCOUNTER — Other Ambulatory Visit

## 2024-02-10 ENCOUNTER — Ambulatory Visit: Admitting: Podiatry

## 2024-03-04 ENCOUNTER — Other Ambulatory Visit

## 2024-03-04 ENCOUNTER — Ambulatory Visit: Admitting: Dermatology

## 2024-03-04 ENCOUNTER — Encounter: Admitting: Surgery

## 2024-03-09 ENCOUNTER — Ambulatory Visit: Admitting: Cardiology

## 2024-03-11 ENCOUNTER — Encounter: Admitting: Surgery
